# Patient Record
Sex: Female | Born: 1940 | Race: White | Hispanic: No | Smoking: Former smoker
Health system: Southern US, Community
[De-identification: ages and names within clinical notes are randomized; demographics above are authoritative.]

## PROBLEM LIST (undated history)

## (undated) DIAGNOSIS — M810 Age-related osteoporosis without current pathological fracture: Secondary | ICD-10-CM

## (undated) DIAGNOSIS — T7840XA Allergy, unspecified, initial encounter: Secondary | ICD-10-CM

## (undated) HISTORY — DX: Allergy, unspecified, initial encounter: T78.40XA

## (undated) HISTORY — PX: EYE SURGERY: SHX253

## (undated) HISTORY — DX: Age-related osteoporosis without current pathological fracture: M81.0

## (undated) HISTORY — PX: ABDOMINAL HYSTERECTOMY: SHX81

---

## 2010-09-28 DIAGNOSIS — C801 Malignant (primary) neoplasm, unspecified: Secondary | ICD-10-CM

## 2010-09-28 HISTORY — DX: Malignant (primary) neoplasm, unspecified: C80.1

## 2020-09-17 LAB — HM MAMMOGRAPHY

## 2020-09-17 LAB — HM DEXA SCAN

## 2020-12-11 ENCOUNTER — Ambulatory Visit: Payer: Medicare Other | Admitting: Family Medicine

## 2020-12-11 ENCOUNTER — Other Ambulatory Visit: Payer: Self-pay

## 2020-12-11 ENCOUNTER — Telehealth: Payer: Self-pay

## 2020-12-11 ENCOUNTER — Encounter: Payer: Self-pay | Admitting: Family Medicine

## 2020-12-11 DIAGNOSIS — M818 Other osteoporosis without current pathological fracture: Secondary | ICD-10-CM

## 2020-12-11 DIAGNOSIS — T386X5A Adverse effect of antigonadotrophins, antiestrogens, antiandrogens, not elsewhere classified, initial encounter: Secondary | ICD-10-CM

## 2020-12-11 DIAGNOSIS — H409 Unspecified glaucoma: Secondary | ICD-10-CM

## 2020-12-11 DIAGNOSIS — I4811 Longstanding persistent atrial fibrillation: Secondary | ICD-10-CM

## 2020-12-11 DIAGNOSIS — I4891 Unspecified atrial fibrillation: Secondary | ICD-10-CM | POA: Insufficient documentation

## 2020-12-11 DIAGNOSIS — Z853 Personal history of malignant neoplasm of breast: Secondary | ICD-10-CM | POA: Insufficient documentation

## 2020-12-11 NOTE — Progress Notes (Signed)
[facility] [address] [place] [address] Dept: [phone] Dept Fax: [phone]  New Patient Office Visit  Subjective:    Patient ID: Nicole Guzman, female    DOB: 07-22-1941, 80 y.o..   MRN: [id number]  Chief Complaint  Patient presents with  . Establish Care    NP- establish Care, no concerns.   Behind on Prolia injection due to moving.     History of Present Illness:  Patient is in today to establish care. [name] moved in January with her husband from [place], [place], where they had lived the last 74 years. She notes that with their age and her husband's health issues, they wanted to be closer to one of their two sons. She is retired form working within [employer] most of her life.  [name] has a history of left breast cancer found by mammogram in 2012. She underwent lumpectomy with lymph node biopsy. She did not require radiation treatment. She is currently on anastrozole. 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 In addition she takes calcium with Vit. D.  [name] has a history of atrial fibrillation. She is on metoprolol for rate control, though she denies any episodes of tachycardia with her a fib. She is also treated with Xarelto for anticoagulation.  [name] has a history of glaucoma. She relates that she had some minor eye procedures, suggesting there may have been a closed-angle component of this. She is currently on Travatan and Combigan drops for this.  [name] notes her husband is in the hospital currently with decompensated heart failure. She expects his release tomorrow.  Past Medical History: Patient Active Problem List   Diagnosis Date Noted  . History of breast cancer 12/11/2020  . Glaucoma 12/11/2020  . Atrial fibrillation ([place]) 12/11/2020  . 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 ABDOMINAL HYSTERECTOMY     age 3  . EYE SURGERY     Family History  Problem Relation Age of Onset  . Hyperlipidemia Mother   . Alzheimer's disease Mother   . Stroke Father   . Heart disease Father   . Alzheimer's disease Maternal Grandmother    Outpatient Medications Prior to Visit  Medication Sig Dispense Refill  . anastrozole (ARIMIDEX) 1 MG tablet Take 1 mg by mouth daily.    . Calcium Carb-Cholecalciferol (CALCIUM 500/D) 500-400 MG-UNIT CHEW Chew by mouth.    . COMBIGAN 0.2-0.5 % ophthalmic solution Apply 1 drop to eye 2 (two) times daily.    Marland Kitchen denosumab (PROLIA) 60 MG/ML SOSY injection Inject 60 mg into the skin every 6 (six) months.    . metoprolol tartrate (LOPRESSOR) 25 MG tablet Take 25 mg by mouth 2 (two) times daily.    . Multiple Vitamins-Minerals (MULTIVITAMIN WITH MINERALS) tablet Take 1 tablet by mouth daily.    . Travoprost, BAK Free, (TRAVATAN) 0.004 % SOLN ophthalmic solution INSTILL 1 DROP INTO EACH EYE EVERY DAY AT BEDTIME    . vitamin C (ASCORBIC ACID) 500 MG tablet Take 500 mg by mouth daily.    . Vitamin D, Cholecalciferol, 25 MCG (1000 UT) TABS Take by mouth.    Alveda Reasons 20 MG TABS tablet Take 20 mg by mouth daily.     No facility-administered medications prior to visit.   No Known Allergies    Objective:  Today's Vitals   12/11/20 1331  BP: 140/84  Pulse: 82  Temp: 98.5 F (36.9 C)  TempSrc: Temporal  SpO2: 94%  Weight: 172 lb 3.2 oz (78.1 kg)  Height: 5\' 5"  (1.651 m)   Body mass index is 28.66 kg/m.   General: Well developed, well nourished. No acute distress. CV: Irregularly irregular rhythm without murmurs or rubs.  Psych: Alert and oriented. Normal mood and affect.  Health Maintenance Due  Topic Date Due  . Hepatitis C Screening  Never done  . TETANUS/TDAP  Never done  . DEXA SCAN  Never done  . PNA vac Low Risk Adult (1 of 2 - PCV13) Never done     Assessment  & Plan:   1. History of breast cancer I will refer her to the [facility] for ongoing consultation regarding her breast cancer after care.  - Ambulatory referral to Oncology  2. Glaucoma of both eyes, unspecified glaucoma type I recommend she seek an appointment with [facility] for ongoing eye care related to her glaucoma.  3. Longstanding persistent atrial fibrillation (HCC) Currently on metoprolol for rate control and Xarelto. We will continue these.  4. 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 Staff will work to [employer] approval for the medication, order this in, and then contact the patient when it arrives to come in for injection.   [name], MD

## 2020-12-11 NOTE — Telephone Encounter (Signed)
NP- she needs to get started back on her Prolia since moving.  Can you please and thank you check into it? She is aware of the the process.  Thanks.  Dm/cma

## 2020-12-12 NOTE — Telephone Encounter (Signed)
I have faxed in paperwork to [employer] to start process.

## 2020-12-16 ENCOUNTER — Telehealth: Payer: Self-pay | Admitting: *Deleted

## 2020-12-16 NOTE — Telephone Encounter (Signed)
Called patient and gave upcoming appointments - mailed calendar with welcome packet. Requested medical records from previous cancer treatment - gave fax number

## 2020-12-26 ENCOUNTER — Encounter: Payer: Self-pay | Admitting: Family Medicine

## 2020-12-26 DIAGNOSIS — E785 Hyperlipidemia, unspecified: Secondary | ICD-10-CM | POA: Insufficient documentation

## 2020-12-26 DIAGNOSIS — K5790 Diverticulosis of intestine, part unspecified, without perforation or abscess without bleeding: Secondary | ICD-10-CM | POA: Insufficient documentation

## 2020-12-26 DIAGNOSIS — J309 Allergic rhinitis, unspecified: Secondary | ICD-10-CM | POA: Insufficient documentation

## 2020-12-26 DIAGNOSIS — E559 Vitamin D deficiency, unspecified: Secondary | ICD-10-CM | POA: Insufficient documentation

## 2021-01-16 ENCOUNTER — Telehealth: Payer: Self-pay

## 2021-01-16 ENCOUNTER — Encounter: Payer: Self-pay | Admitting: Hematology & Oncology

## 2021-01-16 ENCOUNTER — Inpatient Hospital Stay (HOSPITAL_BASED_OUTPATIENT_CLINIC_OR_DEPARTMENT_OTHER): Payer: Medicare Other | Admitting: Hematology & Oncology

## 2021-01-16 ENCOUNTER — Inpatient Hospital Stay: Payer: Medicare Other | Attending: Hematology & Oncology

## 2021-01-16 ENCOUNTER — Other Ambulatory Visit: Payer: Self-pay

## 2021-01-16 VITALS — BP 146/100 | HR 77 | Temp 98.6°F | Resp 18 | Ht 65.0 in | Wt 168.0 lb

## 2021-01-16 DIAGNOSIS — Z170 Estrogen receptor positive status [ER+]: Secondary | ICD-10-CM | POA: Insufficient documentation

## 2021-01-16 DIAGNOSIS — Z923 Personal history of irradiation: Secondary | ICD-10-CM | POA: Diagnosis not present

## 2021-01-16 DIAGNOSIS — Z853 Personal history of malignant neoplasm of breast: Secondary | ICD-10-CM | POA: Insufficient documentation

## 2021-01-16 DIAGNOSIS — Z7901 Long term (current) use of anticoagulants: Secondary | ICD-10-CM | POA: Diagnosis not present

## 2021-01-16 DIAGNOSIS — Z9223 Personal history of estrogen therapy: Secondary | ICD-10-CM | POA: Diagnosis not present

## 2021-01-16 DIAGNOSIS — Z7189 Other specified counseling: Secondary | ICD-10-CM | POA: Diagnosis not present

## 2021-01-16 DIAGNOSIS — Z79899 Other long term (current) drug therapy: Secondary | ICD-10-CM | POA: Insufficient documentation

## 2021-01-16 DIAGNOSIS — C50912 Malignant neoplasm of unspecified site of left female breast: Secondary | ICD-10-CM | POA: Diagnosis not present

## 2021-01-16 DIAGNOSIS — Z87891 Personal history of nicotine dependence: Secondary | ICD-10-CM | POA: Diagnosis not present

## 2021-01-16 HISTORY — DX: Other specified counseling: Z71.89

## 2021-01-16 HISTORY — DX: Malignant neoplasm of unspecified site of left female breast: C50.912

## 2021-01-16 LAB — CMP (CANCER CENTER ONLY)
ALT: 19 U/L (ref 0–44)
AST: 20 U/L (ref 15–41)
Albumin: 3.9 g/dL (ref 3.5–5.0)
Alkaline Phosphatase: 65 U/L (ref 38–126)
Anion gap: 8 (ref 5–15)
BUN: 13 mg/dL (ref 8–23)
CO2: 30 mmol/L (ref 22–32)
Calcium: 10 mg/dL (ref 8.9–10.3)
Chloride: 102 mmol/L (ref 98–111)
Creatinine: 0.76 mg/dL (ref 0.44–1.00)
GFR, Estimated: >60 mL/min (ref >60)
Glucose, Bld: 102 mg/dL — ABNORMAL HIGH (ref 70–99)
Potassium: 4.3 mmol/L (ref 3.5–5.1)
Sodium: 140 mmol/L (ref 135–145)
Total Bilirubin: 0.6 mg/dL (ref 0.3–1.2)
Total Protein: 6.7 g/dL (ref 6.5–8.1)

## 2021-01-16 LAB — CBC WITH DIFFERENTIAL (CANCER CENTER ONLY)
Abs Immature Granulocytes: 0.02 K/uL (ref 0.00–0.07)
Basophils Absolute: 0 K/uL (ref 0.0–0.1)
Basophils Relative: 0 %
Eosinophils Absolute: 0.1 K/uL (ref 0.0–0.5)
Eosinophils Relative: 1 %
HCT: 39.6 % (ref 36.0–46.0)
Hemoglobin: 13 g/dL (ref 12.0–15.0)
Immature Granulocytes: 0 %
Lymphocytes Relative: 27 %
Lymphs Abs: 2 K/uL (ref 0.7–4.0)
MCH: 31.9 pg (ref 26.0–34.0)
MCHC: 32.8 g/dL (ref 30.0–36.0)
MCV: 97.1 fL (ref 80.0–100.0)
Monocytes Absolute: 0.5 K/uL (ref 0.1–1.0)
Monocytes Relative: 7 %
Neutro Abs: 4.9 K/uL (ref 1.7–7.7)
Neutrophils Relative %: 65 %
Platelet Count: 259 K/uL (ref 150–400)
RBC: 4.08 MIL/uL (ref 3.87–5.11)
RDW: 14 % (ref 11.5–15.5)
WBC Count: 7.6 K/uL (ref 4.0–10.5)
nRBC: 0 % (ref 0.0–0.2)

## 2021-01-16 NOTE — Progress Notes (Signed)
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 New Patient (Initial Visit)  : I just moved here from [place].  HPI: Nicole Guzman is a very charming 80 year old postmenopausal white female.  She and her husband have been in the [place].  They have lived there for the whole lives.  However, because of their age and health issues, their family wanted her to move closer.  She has 2 sons who live in [place].  As such, the recently moved here.  Her history with breast cancer goes back to 2012.  She had a mammogram which showed an abnormality in the left breast.  Of note, she was on postmenopausal estrogens after her change of life.  She underwent a lobectomy I think in November 2012.  She was found to have a stage IIa lobular carcinoma.  She then underwent radiation therapy.  Afterwards, she was placed on tamoxifen follow-up by Arimidex.  She has absolutely had no problems.  She has had no issues with the Arimidex.  She does get Prolia twice a year.  She is getting this with her family doctor.  She has had no problems with cough or shortness of breath.  She has had no issues with nausea or vomiting.  There is been no change in bowel or bladder habits.  She does not smoke.  She does not drink.  There is no family history of breast cancer or other malignancy in the family.  She and her husband are now living in their own home.  He has his own health issues.  Overall, I would say performance status is ECOG 1.   Past Medical History:  Diagnosis Date  . Allergy   . Cancer [facility]) 2012   Breast  . Osteoporosis   :  Past Surgical History:  Procedure Laterality Date  . ABDOMINAL HYSTERECTOMY     age 40  . EYE SURGERY    :   Current Outpatient Medications:  .  anastrozole (ARIMIDEX) 1 MG tablet, Take 1 mg by mouth daily., Disp: , Rfl:  .   Calcium Carb-Cholecalciferol (CALCIUM 500/D) 500-400 MG-UNIT CHEW, Chew by mouth., Disp: , Rfl:  .  COMBIGAN 0.2-0.5 % ophthalmic solution, Apply 1 drop to eye 2 (two) times daily., Disp: , Rfl:  .  denosumab (PROLIA) 60 MG/ML SOSY injection, Inject 60 mg into the skin every 6 (six) months., Disp: , Rfl:  .  metoprolol tartrate (LOPRESSOR) 25 MG tablet, Take 25 mg by mouth 2 (two) times daily., Disp: , Rfl:  .  Multiple Vitamins-Minerals (MULTIVITAMIN WITH MINERALS) tablet, Take 1 tablet by mouth daily., Disp: , Rfl:  .  Travoprost, BAK Free, (TRAVATAN) 0.004 % SOLN ophthalmic solution, INSTILL 1 DROP INTO EACH EYE EVERY DAY AT BEDTIME, Disp: , Rfl:  .  vitamin C (ASCORBIC ACID) 500 MG tablet, Take 500 mg by mouth daily., Disp: , Rfl:  .  Vitamin D, Cholecalciferol, 25 MCG (1000 UT) TABS, Take by mouth., Disp: , Rfl:  .  XARELTO 20 MG TABS tablet, Take 20 mg by mouth daily., Disp: , Rfl: :  :  No Known Allergies:  Family History  Problem Relation Age of Onset  . Hyperlipidemia Mother   . Alzheimer's disease Mother   . Stroke Father   . Heart disease Father   . Alzheimer's disease Maternal Grandmother   :  Social History   Socioeconomic  History  . Marital status: Married    Spouse name: Not on file  . Number of children: Not on file  . Years of education: Not on file  . Highest education level: Not on file  Occupational History  . Not on file  Tobacco Use  . Smoking status: Former Research scientist (life sciences)  . Smokeless tobacco: Never Used  . Tobacco comment: quit 54 years ago  Vaping Use  . Vaping Use: Never used  Substance and Sexual Activity  . Alcohol use: Not Currently  . Drug use: Never  . Sexual activity: Not Currently  Other Topics Concern  . Not on file  Social History Narrative  . Not on file   Social Determinants of Health   Financial Resource Strain: Not on file  Food Insecurity: Not on file  Transportation Needs: Not on file  Physical Activity: Not on file  Stress: Not  on file  Social Connections: Not on file  Intimate Partner Violence: Not on file  :  Review of Systems  Constitutional: Negative.   HENT: Negative.   Eyes: Negative.   Respiratory: Negative.   Cardiovascular: Negative.   Gastrointestinal: Negative.   Genitourinary: Negative.   Musculoskeletal: Negative.   Skin: Negative.   Neurological: Negative.   Endo/Heme/Allergies: Negative.   Psychiatric/Behavioral: Negative.      Exam:  This is a well-developed and well-nourished white female in no obvious distress.  Vital signs show temperature of 98.6.  Pulse 77.  Blood pressure 146/100.  Weight is 168 pounds.  Head and neck exam shows no ocular or oral lesions.  There are no palpable cervical or supraclavicular lymph nodes.  Lungs are clear bilaterally.  Cardiac exam regular rate and rhythm with no murmurs, rubs or bruits.  Abdomen is soft.  She has good bowel sounds.  There is no fluid wave.  There is no palpable liver or spleen tip.  Breast exam shows right breast with no masses, edema or erythema.  There is no right axillary adenopathy.  Left breast is slightly contracted from surgery and radiation.  She has a lumpectomy scar at about the 8 o'clock position.  This is well-healed.  There is no left nipple abnormalities.  There is no left axillary adenopathy.  Back exam shows no tenderness over the spine, ribs or hips.  Extremities shows no clubbing, cyanosis or edema.  There is no swelling in the left arm.  Neurological exam shows no focal neurological deficits.  Skin exam shows no rashes, ecchymoses or petechia.   @IPVITALS @   Recent Labs    01/16/21 1322  WBC 7.6  HGB 13.0  HCT 39.6  PLT 259   Recent Labs    01/16/21 1322  NA 140  K 4.3  CL 102  CO2 30  GLUCOSE 102*  BUN 13  CREATININE 0.76  CALCIUM 10.0    Blood smear review: None  Pathology: See above    Assessment and Plan: Nicole Guzman is a very charming 80 year old white female with a stage IIa lobular  carcinoma of the left breast.  She underwent lumpectomy and radiation.  She currently is on Arimidex.  She is now 10 years out from therapy.  I told her that she can go ahead and stop the Arimidex if she would like.  I really do not think that she needs to be on it after this year at least.  I really think that the risk of her recurrence is probably going to be less than 10%.  She gets  her Prolia from her family doctor.  I think this is going be very helpful for her.  I think that we can just watch her in 6 months.  I do not see that we have to do any scans on her.  I am just happy that she has moved to the area.  She is happy that she is with her family.  I just hope her husband can get through his health issues.  I gave her a prayer blanket.  She is very thankful for this.

## 2021-01-16 NOTE — Telephone Encounter (Signed)
appts made and printed for pt per 01/16/21 los   Ambrie Carte 

## 2021-01-21 ENCOUNTER — Other Ambulatory Visit: Payer: Self-pay

## 2021-01-21 ENCOUNTER — Ambulatory Visit: Payer: Medicare Other

## 2021-01-21 DIAGNOSIS — M818 Other osteoporosis without current pathological fracture: Secondary | ICD-10-CM

## 2021-01-21 DIAGNOSIS — T386X5A Adverse effect of antigonadotrophins, antiestrogens, antiandrogens, not elsewhere classified, initial encounter: Secondary | ICD-10-CM

## 2021-01-21 MED ORDER — DENOSUMAB 60 MG/ML ~~LOC~~ SOSY
60.0000 mg | PREFILLED_SYRINGE | Freq: Once | SUBCUTANEOUS | Status: AC
  Administered 2021-01-21: 60 mg via SUBCUTANEOUS

## 2021-01-21 NOTE — Progress Notes (Signed)
Per orders of Dr. Gena Fray injection of Prolia given by Verline Lema L Breana Litts in left arm. Patient tolerated injection well. No signs or symptoms of a reaction were noted prior to patient leaving the nurse visit. Patient will make appointment for 6 months.

## 2021-01-21 NOTE — Patient Instructions (Signed)
Health Maintenance Due  Topic Date Due  . Hepatitis C Screening  Never done  . TETANUS/TDAP  Never done  . PNA vac Low Risk Adult (1 of 2 - PCV13) Never done    Depression screen [facility] 2/9 12/11/2020  Decreased Interest 0  Down, Depressed, Hopeless 0  PHQ - 2 Score 0

## 2021-02-11 ENCOUNTER — Ambulatory Visit (INDEPENDENT_AMBULATORY_CARE_PROVIDER_SITE_OTHER): Payer: Medicare Other

## 2021-02-11 VITALS — Ht 65.0 in | Wt 168.0 lb

## 2021-02-11 DIAGNOSIS — Z0000 Encounter for general adult medical examination without abnormal findings: Secondary | ICD-10-CM

## 2021-02-11 NOTE — Progress Notes (Signed)
Subjective:   Nicole Guzman is a 80 y.o. female who presents for an Initial Medicare Annual Wellness Visit.  I connected with Zarrah today by telephone and verified that I am speaking with the correct person using two identifiers. Location patient: home Location provider: work Persons participating in the virtual visit: patient, [name].    I discussed the limitations, risks, security and privacy concerns of performing an evaluation and management service by telephone and the availability of in person appointments. I also discussed with the patient that there may be a patient responsible charge related to this service. The patient expressed understanding and verbally consented to this telephonic visit.    Interactive audio and video telecommunications were attempted between this provider and patient, however failed, due to patient having technical difficulties OR patient did not have access to video capability.  We continued and completed visit with audio only.  Some vital signs may be absent or patient reported.   Time Spent with patient on telephone encounter: 25 minutes   Review of Systems     Cardiac Risk Factors include: advanced age (>32men, >12 women)     Objective:    Today's Vitals   02/11/21 0946  Weight: 168 lb (76.2 kg)  Height: 5\' 5"  (1.651 m)   Body mass index is 27.96 kg/m.  Advanced Directives 02/11/2021 01/16/2021  Does Patient Have a Medical Advance Directive? No No  Would patient like information on creating a medical advance directive? No - Patient declined No - Patient declined    Current Medications (verified) Outpatient Encounter Medications as of 02/11/2021  Medication Sig  . anastrozole (ARIMIDEX) 1 MG tablet Take 1 mg by mouth daily.  . Calcium Carb-Cholecalciferol (CALCIUM 500/D) 500-400 MG-UNIT CHEW Chew by mouth.  . COMBIGAN 0.2-0.5 % ophthalmic solution Apply 1 drop to eye 2 (two) times daily.  Marland Kitchen denosumab (PROLIA) 60 MG/ML SOSY injection  Inject 60 mg into the skin every 6 (six) months.  . metoprolol tartrate (LOPRESSOR) 25 MG tablet Take 25 mg by mouth 2 (two) times daily.  . Multiple Vitamins-Minerals (MULTIVITAMIN WITH MINERALS) tablet Take 1 tablet by mouth daily.  . Travoprost, BAK Free, (TRAVATAN) 0.004 % SOLN ophthalmic solution INSTILL 1 DROP INTO EACH EYE EVERY DAY AT BEDTIME  . vitamin C (ASCORBIC ACID) 500 MG tablet Take 500 mg by mouth daily.  . Vitamin D, Cholecalciferol, 25 MCG (1000 UT) TABS Take by mouth.  Nicole Guzman 20 MG TABS tablet Take 20 mg by mouth daily.   No facility-administered encounter medications on file as of 02/11/2021.    Allergies (verified) Patient has no known allergies.   History: Past Medical History:  Diagnosis Date  . Allergy   . Cancer [facility]) 2012   Breast  . Goals of care, counseling/discussion 01/16/2021  . Osteoporosis   . Stage II breast cancer, left ([place]) 01/16/2021   Past Surgical History:  Procedure Laterality Date  . ABDOMINAL HYSTERECTOMY     age 87  . EYE SURGERY     Family History  Problem Relation Age of Onset  . Hyperlipidemia Mother   . Alzheimer's disease Mother   . Stroke Father   . Heart disease Father   . Alzheimer's disease Maternal Grandmother    Social History   Socioeconomic History  . Marital status: Married    Spouse name: Not on file  . Number of children: Not on file  . Years of education: Not on file  . Highest education level: Not on file  Occupational History  . Occupation: retired  Tobacco Use  . Smoking status: Former Research scientist (life sciences)  . Smokeless tobacco: Never Used  . Tobacco comment: quit 54 years ago  Vaping Use  . Vaping Use: Never used  Substance and Sexual Activity  . Alcohol use: Not Currently  . Drug use: Never  . Sexual activity: Not Currently  Other Topics Concern  . Not on file  Social History Narrative  . Not on file   Social Determinants of Health   Financial Resource Strain: Low Risk   . Difficulty of Paying Living  Expenses: Not hard at all  Food Insecurity: No Food Insecurity  . Worried About Charity fundraiser in the Last Year: Never true  . Ran Out of Food in the Last Year: Never true  Transportation Needs: No Transportation Needs  . Lack of Transportation (Medical): No  . Lack of Transportation (Non-Medical): No  Physical Activity: Inactive  . Days of Exercise per Week: 0 days  . Minutes of Exercise per Session: 0 min  Stress: No Stress Concern Present  . Feeling of Stress : Not at all  Social Connections: Moderately Integrated  . Frequency of Communication with Friends and Family: More than three times a week  . Frequency of Social Gatherings with Friends and Family: More than three times a week  . Attends Religious Services: More than 4 times per year  . Active Member of Clubs or Organizations: No  . Attends Archivist Meetings: Never  . Marital Status: Married    Tobacco Counseling Counseling given: Not Answered Comment: quit 54 years ago   Clinical Intake:  Pre-visit preparation completed: Yes  Pain : No/denies pain     Nutritional Status: BMI 25 -29 Overweight Nutritional Risks: None Diabetes: No  How often do you need to have someone help you when you read instructions, pamphlets, or other written materials from your doctor or pharmacy?: 1 - Never  Diabetic?No  Interpreter Needed?: No  Information entered by :: Caroleen Hamman LPN   Activities of Daily Living In your present state of health, do you have any difficulty performing the following activities: 02/11/2021  Hearing? N  Vision? N  Difficulty concentrating or making decisions? N  Walking or climbing stairs? N  Dressing or bathing? N  Doing errands, shopping? N  Preparing Food and eating ? N  Using the Toilet? N  In the past six months, have you accidently leaked urine? N  Do you have problems with loss of bowel control? N  Managing your Medications? N  Managing your Finances? N  Housekeeping  or managing your Housekeeping? N    Patient Care Team: [name], MD as PCP - General (Family Medicine) Marin Olp Rudell Cobb, MD as Consulting Physician (Oncology)  Indicate any recent Medical Services you may have received from other than [facility] providers in the past year (date may be approximate).     Assessment:   This is a routine wellness examination for [place].  Hearing/Vision screen  Hearing Screening   125Hz  250Hz  500Hz  1000Hz  2000Hz  3000Hz  4000Hz  6000Hz  8000Hz   Right ear:           Left ear:           Comments: Mild hearing loss per patient  Vision Screening Comments: Wears glasses Last eye exam-01/2021-Dr. Julien Girt  Dietary issues and exercise activities discussed: Current Exercise Habits: The patient does not participate in regular exercise at present, Exercise limited by: None identified  Goals Addressed  This Visit's Progress   . Patient Stated       Eat healthier & start walking      Depression Screen PHQ 2/9 Scores 02/11/2021 12/11/2020  PHQ - 2 Score 0 0    Fall Risk Fall Risk  02/11/2021 12/11/2020  Falls in the past year? 0 1  Number falls in past yr: 0 0  Injury with Fall? 0 1  Comment - Rt srist sprain  Follow up Falls prevention discussed -    FALL RISK PREVENTION PERTAINING TO THE HOME:  Any stairs in or around the home? No  Home free of loose throw rugs in walkways, pet beds, electrical cords, etc? Yes  Adequate lighting in your home to reduce risk of falls? Yes   ASSISTIVE DEVICES UTILIZED TO PREVENT FALLS:  Life alert? No  Use of a cane, walker or w/c? No  Grab bars in the bathroom? Yes  Shower chair or bench in shower? No  Elevated toilet seat or a handicapped toilet? No   TIMED UP AND GO:  Was the test performed? No . Phone visit   Cognitive Function:Normal cognitive status assessed by this Nurse Health Advisor. No abnormalities found.          Immunizations Immunization History  Administered Date(s)  Administered  . Influenza-Unspecified 08/14/2020  . Moderna Sars-Covid-2 Vaccination 10/24/2019, 11/22/2019, 08/25/2020    TDAP status: Due, Education has been provided regarding the importance of this vaccine. Advised may receive this vaccine at local pharmacy or Health Dept. Aware to provide a copy of the vaccination record if obtained from local pharmacy or Health Dept. Verbalized acceptance and understanding.  Flu Vaccine status: Up to date  Pneumococcal vaccine status: Due, Education has been provided regarding the importance of this vaccine. Advised may receive this vaccine at local pharmacy or Health Dept. Aware to provide a copy of the vaccination record if obtained from local pharmacy or Health Dept. Verbalized acceptance and understanding.  Covid-19 vaccine status: Completed vaccines  Qualifies for Shingles Vaccine? Yes   Zostavax completed No   Shingrix Completed?: No.    Education has been provided regarding the importance of this vaccine. Patient has been advised to call insurance company to determine out of pocket expense if they have not yet received this vaccine. Advised may also receive vaccine at local pharmacy or Health Dept. Verbalized acceptance and understanding.  Screening Tests Health Maintenance  Topic Date Due  . Hepatitis C Screening  Never done  . TETANUS/TDAP  Never done  . PNA vac Low Risk Adult (1 of 2 - PCV13) Never done  . COVID-19 Vaccine (4 - Booster for Moderna series) 11/25/2020  . INFLUENZA VACCINE  04/28/2021  . DEXA SCAN  Completed  . HPV VACCINES  Aged Out    Health Maintenance  Health Maintenance Due  Topic Date Due  . Hepatitis C Screening  Never done  . TETANUS/TDAP  Never done  . PNA vac Low Risk Adult (1 of 2 - PCV13) Never done  . COVID-19 Vaccine (4 - Booster for Moderna series) 11/25/2020    Colorectal cancer screening: No longer required.   Mammogram status: Completed Bilateral 09/17/2020. Repeat every year  Bone Density  status: Completed 09/17/2020. Results reflect: Bone density results: OSTEOPENIA. Repeat every 2 years.  Lung Cancer Screening: (Low Dose CT Chest recommended if Age 54-80 years, 30 pack-year currently smoking OR have quit w/in 15years.) does not qualify.     Additional Screening:  Hepatitis C Screening: does not qualify  Vision Screening: Recommended annual ophthalmology exams for early detection of glaucoma and other disorders of the eye. Is the patient up to date with their annual eye exam?  Yes  Who is the provider or what is the name of the office in which the patient attends annual eye exams? Dr. Julien Girt  Dental Screening: Recommended annual dental exams for proper oral hygiene  Community Resource Referral / Chronic Care Management: CRR required this visit?  No   CCM required this visit?  No      Plan:     I have personally reviewed and noted the following in the patient's chart:   . Medical and social history . Use of alcohol, tobacco or illicit drugs  . Current medications and supplements including opioid prescriptions. Patient is not currently taking opioid prescriptions. . Functional ability and status . Nutritional status . Physical activity . Advanced directives . List of other physicians . Hospitalizations, surgeries, and ER visits in previous 12 months . Vitals . Screenings to include cognitive, depression, and falls . Referrals and appointments  In addition, I have reviewed and discussed with patient certain preventive protocols, quality metrics, and best practice recommendations. A written personalized care plan for preventive services as well as general preventive health recommendations were provided to patient.   Due to this being a telephonic visit, the after visit summary with patients personalized plan was offered to patient via mail or my-chart.  Patient would like to access on my-chart.    Marta Antu, LPN   1/60/1093  Nurse Health  Advisor  Nurse Notes: None

## 2021-02-11 NOTE — Patient Instructions (Signed)
[name] , Thank you for taking time to complete your Medicare Wellness Visit. I appreciate your ongoing commitment to your health goals. Please review the following plan we discussed and let me know if I can assist you in the future.   Screening recommendations/referrals: Colonoscopy: No longer required Mammogram: Completed 09/17/2020-Due 09/17/2021 Bone Density: Completed 09/17/2020-Due 09/17/2022 Recommended yearly ophthalmology/optometry visit for glaucoma screening and checkup Recommended yearly dental visit for hygiene and checkup  Vaccinations: Influenza vaccine: Up to date Pneumococcal vaccine: Up tot date Tdap vaccine: Discuss with pharmacy Shingles vaccine: Discuss with pharmacy   Covid-19:Up to date  Advanced directives: Declined information today  Conditions/risks identified: See problem list  Next appointment: Follow up in one year for your annual wellness visit 02/17/2022 @ 9:45   Preventive Care 65 Years and Older, Female Preventive care refers to lifestyle choices and visits with your health care provider that can promote health and wellness. What does preventive care include?  A yearly physical exam. This is also called an annual well check.  Dental exams once or twice a year.  Routine eye exams. Ask your health care provider how often you should have your eyes checked.  Personal lifestyle choices, including:  Daily care of your teeth and gums.  Regular physical activity.  Eating a healthy diet.  Avoiding tobacco and drug use.  Limiting alcohol use.  Practicing safe sex.  Taking low-dose aspirin every day.  Taking vitamin and mineral supplements as recommended by your health care provider. What happens during an annual well check? The services and screenings done by your health care provider during your annual well check will depend on your age, overall health, lifestyle risk factors, and family history of disease. Counseling  Your health care  provider may ask you questions about your:  Alcohol use.  Tobacco use.  Drug use.  Emotional well-being.  Home and relationship well-being.  Sexual activity.  Eating habits.  History of falls.  Memory and ability to understand (cognition).  Work and work Statistician.  Reproductive health. Screening  You may have the following tests or measurements:  Height, weight, and BMI.  Blood pressure.  Lipid and cholesterol levels. These may be checked every 5 years, or more frequently if you are over 40 years old.  Skin check.  Lung cancer screening. You may have this screening every year starting at age 59 if you have a 30-pack-year history of smoking and currently smoke or have quit within the past 15 years.  Fecal occult blood test (FOBT) of the stool. You may have this test every year starting at age 13.  Flexible sigmoidoscopy or colonoscopy. You may have a sigmoidoscopy every 5 years or a colonoscopy every 10 years starting at age 17.  Hepatitis C blood test.  Hepatitis B blood test.  Sexually transmitted disease (STD) testing.  Diabetes screening. This is done by checking your blood sugar (glucose) after you have not eaten for a while (fasting). You may have this done every 1-3 years.  Bone density scan. This is done to screen for osteoporosis. You may have this done starting at age 90.  Mammogram. This may be done every 1-2 years. Talk to your health care provider about how often you should have regular mammograms. Talk with your health care provider about your test results, treatment options, and if necessary, the need for more tests. Vaccines  Your health care provider may recommend certain vaccines, such as:  Influenza vaccine. This is recommended every year.  Tetanus, diphtheria,  and acellular pertussis (Tdap, Td) vaccine. You may need a Td booster every 10 years.  Zoster vaccine. You may need this after age 40.  Pneumococcal 13-valent conjugate (PCV13)  vaccine. One dose is recommended after age 76.  Pneumococcal polysaccharide (PPSV23) vaccine. One dose is recommended after age 69. Talk to your health care provider about which screenings and vaccines you need and how often you need them. This information is not intended to replace advice given to you by your health care provider. Make sure you discuss any questions you have with your health care provider. Document Released: 10/11/2015 Document Revised: 06/03/2016 Document Reviewed: 07/16/2015 Elsevier Interactive Patient Education  2017 [employer] Prevention in the Home Falls can cause injuries. They can happen to people of all ages. There are many things you can do to make your home safe and to help prevent falls. What can I do on the outside of my home?  Regularly fix the edges of walkways and driveways and fix any cracks.  Remove anything that might make you trip as you walk through a door, such as a raised step or threshold.  Trim any bushes or trees on the path to your home.  Use bright outdoor lighting.  Clear any walking paths of anything that might make someone trip, such as rocks or tools.  Regularly check to see if handrails are loose or broken. Make sure that both sides of any steps have handrails.  Any raised decks and porches should have guardrails on the edges.  Have any leaves, snow, or ice cleared regularly.  Use sand or salt on walking paths during winter.  Clean up any spills in your garage right away. This includes oil or grease spills. What can I do in the bathroom?  Use night lights.  Install grab bars by the toilet and in the tub and shower. Do not use towel bars as grab bars.  Use non-skid mats or decals in the tub or shower.  If you need to sit down in the shower, use a plastic, non-slip stool.  Keep the floor dry. Clean up any water that spills on the floor as soon as it happens.  Remove soap buildup in the tub or shower  regularly.  Attach bath mats securely with double-sided non-slip rug tape.  Do not have throw rugs and other things on the floor that can make you trip. What can I do in the bedroom?  Use night lights.  Make sure that you have a light by your bed that is easy to reach.  Do not use any sheets or blankets that are too big for your bed. They should not hang down onto the floor.  Have a firm chair that has side arms. You can use this for support while you get dressed.  Do not have throw rugs and other things on the floor that can make you trip. What can I do in the kitchen?  Clean up any spills right away.  Avoid walking on wet floors.  Keep items that you use a lot in easy-to-reach places.  If you need to reach something above you, use a strong step stool that has a grab bar.  Keep electrical cords out of the way.  Do not use floor polish or wax that makes floors slippery. If you must use wax, use non-skid floor wax.  Do not have throw rugs and other things on the floor that can make you trip. What can I do with my  stairs?  Do not leave any items on the stairs.  Make sure that there are handrails on both sides of the stairs and use them. Fix handrails that are broken or loose. Make sure that handrails are as long as the stairways.  Check any carpeting to make sure that it is firmly attached to the stairs. Fix any carpet that is loose or worn.  Avoid having throw rugs at the top or bottom of the stairs. If you do have throw rugs, attach them to the floor with carpet tape.  Make sure that you have a light switch at the top of the stairs and the bottom of the stairs. If you do not have them, ask someone to add them for you. What else can I do to help prevent falls?  Wear shoes that:  Do not have high heels.  Have rubber bottoms.  Are comfortable and fit you well.  Are closed at the toe. Do not wear sandals.  If you use a stepladder:  Make sure that it is fully  opened. Do not climb a closed stepladder.  Make sure that both sides of the stepladder are locked into place.  Ask someone to hold it for you, if possible.  Clearly mark and make sure that you can see:  Any grab bars or handrails.  First and last steps.  Where the edge of each step is.  Use tools that help you move around (mobility aids) if they are needed. These include:  Canes.  Walkers.  Scooters.  Crutches.  Turn on the lights when you go into a dark area. Replace any light bulbs as soon as they burn out.  Set up your furniture so you have a clear path. Avoid moving your furniture around.  If any of your floors are uneven, fix them.  If there are any pets around you, be aware of where they are.  Review your medicines with your doctor. Some medicines can make you feel dizzy. This can increase your chance of falling. Ask your doctor what other things that you can do to help prevent falls. This information is not intended to replace advice given to you by your health care provider. Make sure you discuss any questions you have with your health care provider. Document Released: 07/11/2009 Document Revised: 02/20/2016 Document Reviewed: 10/19/2014 Elsevier Interactive Patient Education  2017 [employer].

## 2021-03-12 ENCOUNTER — Other Ambulatory Visit: Payer: Self-pay

## 2021-03-13 ENCOUNTER — Ambulatory Visit: Payer: Medicare Other | Admitting: Family Medicine

## 2021-03-13 ENCOUNTER — Encounter: Payer: Self-pay | Admitting: Family Medicine

## 2021-03-13 VITALS — BP 116/76 | HR 76 | Temp 97.8°F | Ht 65.0 in | Wt 165.8 lb

## 2021-03-13 DIAGNOSIS — I4811 Longstanding persistent atrial fibrillation: Secondary | ICD-10-CM | POA: Diagnosis not present

## 2021-03-13 DIAGNOSIS — T386X5A Adverse effect of antigonadotrophins, antiestrogens, antiandrogens, not elsewhere classified, initial encounter: Secondary | ICD-10-CM

## 2021-03-13 DIAGNOSIS — Z853 Personal history of malignant neoplasm of breast: Secondary | ICD-10-CM | POA: Diagnosis not present

## 2021-03-13 DIAGNOSIS — H409 Unspecified glaucoma: Secondary | ICD-10-CM | POA: Diagnosis not present

## 2021-03-13 DIAGNOSIS — M818 Other osteoporosis without current pathological fracture: Secondary | ICD-10-CM | POA: Diagnosis not present

## 2021-03-13 NOTE — Progress Notes (Signed)
Nicole Guzman  Chronic Care Office Visit  Subjective:    Patient ID: Nicole Guzman, female    DOB: 09-03-1941, 80 y.o..   MRN: [id number]  Chief Complaint  Patient presents with   Follow-up    3 month f/u.   No concerns.     History of Present Illness:  Patient is in today for reassessment of chronic medical issues.  Nicole Guzman notes that she is doing well. She does have concerns for her husband's health, but feels her own issues are stable. She has not had any issues with palpitations related to her atrial fibrillation and continues on Xarelto. She denies any dsypnea or lower extremity swelling.  Since her last visit she has established with Dr. Julien Girt (eye) for monitoring of her glaucoma. She was also seen by Dr. Marin Olp for aftercare related to her breast cancer. She remains on Arimidex.  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     Calcium Carb-Cholecalciferol (CALCIUM 500/D) 500-400 MG-UNIT CHEW Chew by mouth.     COMBIGAN 0.2-0.5 % ophthalmic  solution Apply 1 drop to eye 2 (two) times daily.     denosumab (PROLIA) 60 MG/ML SOSY injection Inject 60 mg into the skin every 6 (six) months.     metoprolol tartrate (LOPRESSOR) 25 MG tablet Take 25 mg by mouth 2 (two) times daily.     Multiple Vitamins-Minerals (MULTIVITAMIN WITH MINERALS) tablet Take 1 tablet by mouth daily.     Travoprost, BAK Free, (TRAVATAN) 0.004 % SOLN ophthalmic solution INSTILL 1 DROP INTO EACH EYE EVERY DAY AT BEDTIME     vitamin C (ASCORBIC ACID) 500 MG tablet Take 500 mg by mouth daily.     Vitamin D, Cholecalciferol, 25 MCG (1000 UT) TABS Take by mouth.     XARELTO 20 MG TABS tablet Take 20 mg by mouth daily.     No facility-administered medications prior to visit.   No Known Allergies    Objective:   Today's Vitals   03/13/21 1016  BP: 116/76  Pulse: 76  Temp: 97.8 F (36.6 C)  TempSrc: Temporal  SpO2: 96%  Weight: 165 lb 12.8 oz (75.2 kg)  Height: 5\' 5"  (1.651 m)   Body mass index is 27.59 kg/m.   General: Well developed, well nourished. No acute distress. CV: RRR without murmurs or rubs. Pulses 2+ bilaterally. Extremities: No edema noted. Psych: Alert and oriented. Normal mood and affect.  Health Maintenance Due  Topic Date Due   Hepatitis C Screening  Never done   TETANUS/TDAP  Never done   Zoster Vaccines- Shingrix (1 of 2) Never done   PNA vac Low Risk Adult (1 of 2 - PCV13) Never done   COVID-19 Vaccine (4 - Booster for Moderna series) 11/25/2020     Assessment & Plan:   1. Longstanding persistent atrial fibrillation (HCC) Stable on metoprolol and Xarelto. Recommend reassessment in 3 months.  2. 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 Stable.  3. History of breast cancer Reviewed consult notes from oncology. Stable on Arimidex.  4. Glaucoma of both eyes, unspecified glaucoma type Continue Travatan. She will continue to follow with Dr. Julien Girt.   [name], MD

## 2021-04-18 ENCOUNTER — Other Ambulatory Visit: Payer: Self-pay | Admitting: Family Medicine

## 2021-04-18 DIAGNOSIS — I4811 Longstanding persistent atrial fibrillation: Secondary | ICD-10-CM

## 2021-04-18 MED ORDER — METOPROLOL TARTRATE 25 MG PO TABS: 25.0000 mg | ORAL_TABLET | Freq: Two times a day (BID) | ORAL | 3 refills | Status: DC

## 2021-04-18 NOTE — Progress Notes (Signed)
Patient requested refill of metoprolol during her husband's medical appointment today.  1. Longstanding persistent atrial fibrillation (HCC)  - metoprolol tartrate (LOPRESSOR) 25 MG tablet; Take 1 tablet (25 mg total) by mouth 2 (two) times daily.  Dispense: 180 tablet; Refill: 3

## 2021-06-11 ENCOUNTER — Other Ambulatory Visit: Payer: Self-pay

## 2021-06-13 ENCOUNTER — Ambulatory Visit: Payer: Medicare Other | Admitting: Family Medicine

## 2021-06-17 ENCOUNTER — Other Ambulatory Visit: Payer: Self-pay | Admitting: *Deleted

## 2021-06-17 MED ORDER — ANASTROZOLE 1 MG PO TABS: 1.0000 mg | ORAL_TABLET | Freq: Every day | ORAL | 6 refills | Status: DC

## 2021-07-08 ENCOUNTER — Ambulatory Visit (INDEPENDENT_AMBULATORY_CARE_PROVIDER_SITE_OTHER): Payer: Medicare Other

## 2021-07-08 DIAGNOSIS — Z23 Encounter for immunization: Secondary | ICD-10-CM | POA: Diagnosis not present

## 2021-07-08 NOTE — Progress Notes (Signed)
Per orders of Dr Gena Fray, injection of Influenza given by Armandina Gemma, cma.  Patient tolerated injection well.

## 2021-07-18 ENCOUNTER — Inpatient Hospital Stay: Payer: Medicare Other | Attending: Hematology & Oncology

## 2021-07-18 ENCOUNTER — Encounter: Payer: Self-pay | Admitting: Hematology & Oncology

## 2021-07-18 ENCOUNTER — Inpatient Hospital Stay: Payer: Medicare Other | Admitting: Hematology & Oncology

## 2021-07-18 ENCOUNTER — Other Ambulatory Visit: Payer: Self-pay

## 2021-07-18 VITALS — BP 150/54 | HR 50 | Temp 98.4°F | Resp 18 | Wt 162.0 lb

## 2021-07-18 DIAGNOSIS — C50912 Malignant neoplasm of unspecified site of left female breast: Secondary | ICD-10-CM | POA: Insufficient documentation

## 2021-07-18 DIAGNOSIS — Z170 Estrogen receptor positive status [ER+]: Secondary | ICD-10-CM | POA: Diagnosis not present

## 2021-07-18 DIAGNOSIS — Z79811 Long term (current) use of aromatase inhibitors: Secondary | ICD-10-CM | POA: Diagnosis not present

## 2021-07-18 DIAGNOSIS — Z853 Personal history of malignant neoplasm of breast: Secondary | ICD-10-CM

## 2021-07-18 LAB — CBC WITH DIFFERENTIAL (CANCER CENTER ONLY)
Abs Immature Granulocytes: 0.02 10*3/uL (ref 0.00–0.07)
Basophils Absolute: 0 10*3/uL (ref 0.0–0.1)
Basophils Relative: 1 %
Eosinophils Absolute: 0.1 10*3/uL (ref 0.0–0.5)
Eosinophils Relative: 1 %
HCT: 40.5 % (ref 36.0–46.0)
Hemoglobin: 13.1 g/dL (ref 12.0–15.0)
Immature Granulocytes: 0 %
Lymphocytes Relative: 32 %
Lymphs Abs: 2.1 10*3/uL (ref 0.7–4.0)
MCH: 31.3 pg (ref 26.0–34.0)
MCHC: 32.3 g/dL (ref 30.0–36.0)
MCV: 96.9 fL (ref 80.0–100.0)
Monocytes Absolute: 0.5 10*3/uL (ref 0.1–1.0)
Monocytes Relative: 8 %
Neutro Abs: 3.9 10*3/uL (ref 1.7–7.7)
Neutrophils Relative %: 58 %
Platelet Count: 266 10*3/uL (ref 150–400)
RBC: 4.18 MIL/uL (ref 3.87–5.11)
RDW: 14.1 % (ref 11.5–15.5)
WBC Count: 6.6 10*3/uL (ref 4.0–10.5)
nRBC: 0 % (ref 0.0–0.2)

## 2021-07-18 LAB — CMP (CANCER CENTER ONLY)
ALT: 16 U/L (ref 0–44)
AST: 18 U/L (ref 15–41)
Albumin: 3.9 g/dL (ref 3.5–5.0)
Alkaline Phosphatase: 56 U/L (ref 38–126)
Anion gap: 6 (ref 5–15)
BUN: 16 mg/dL (ref 8–23)
CO2: 32 mmol/L (ref 22–32)
Calcium: 10.4 mg/dL — ABNORMAL HIGH (ref 8.9–10.3)
Chloride: 101 mmol/L (ref 98–111)
Creatinine: 0.83 mg/dL (ref 0.44–1.00)
GFR, Estimated: >60 mL/min (ref >60)
Glucose, Bld: 92 mg/dL (ref 70–99)
Potassium: 4.4 mmol/L (ref 3.5–5.1)
Sodium: 139 mmol/L (ref 135–145)
Total Bilirubin: 0.7 mg/dL (ref 0.3–1.2)
Total Protein: 6.9 g/dL (ref 6.5–8.1)

## 2021-07-18 NOTE — Progress Notes (Signed)
Hematology and Oncology Follow Up Visit  Nicole Guzman [id number] 1941-02-19 80 y.o. 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  We first saw her back in April 2022.  [place]  [place].  Her husband comes in with her.  He recently had surgery for what seems to be a squamous cell carcinoma of the skull.  She is still on the Arimidex.  I told her that she can stop the Arimidex.  She has been on antiestrogen therapy for 10 years.  I think this is enough.  She has had no problems with cough or shortness of breath.  She has had no issues with nausea or vomiting.  There is been no problems with bowels or bladder.  She is due for a mammogram in December.  We will have to get this set up for her.  She gets Prolia at her family doctor's office.  I think she might be due for this now.  She has had no leg swelling.  She has had no rashes.  There is been no headache.  She has had no abdominal pain.  Overall, her performance status is ECOG 0.  Medications:  Current Outpatient Medications:    anastrozole (ARIMIDEX) 1 MG tablet, Take 1 tablet (1 mg total) by mouth daily., Disp: 30 tablet, Rfl: 6   Calcium Carb-Cholecalciferol (CALCIUM 500/D) 500-400 MG-UNIT CHEW, Chew by mouth., Disp: , Rfl:    COMBIGAN 0.2-0.5 % ophthalmic solution, Apply 1 drop to eye 2 (two) times daily., Disp: , Rfl:    denosumab (PROLIA) 60 MG/ML SOSY injection, Inject 60 mg into the skin every 6 (six) months., Disp: , Rfl:    metoprolol tartrate (LOPRESSOR) 25 MG tablet, Take 1 tablet (25 mg total) by mouth 2 (two) times daily., Disp: 180 tablet, Rfl: 3   Multiple Vitamins-Minerals (MULTIVITAMIN  WITH MINERALS) tablet, Take 1 tablet by mouth daily., Disp: , Rfl:    Travoprost, BAK Free, (TRAVATAN) 0.004 % SOLN ophthalmic solution, INSTILL 1 DROP INTO EACH EYE EVERY DAY AT BEDTIME, Disp: , Rfl:    vitamin C (ASCORBIC ACID) 500 MG tablet, Take 500 mg by mouth daily., Disp: , Rfl:    Vitamin D, Cholecalciferol, 25 MCG (1000 UT) TABS, Take by mouth., Disp: , Rfl:    XARELTO 20 MG TABS tablet, Take 20 mg by mouth daily., Disp: , Rfl:   Allergies: No Known Allergies  Past Medical History, Surgical history, Social history, and Family History were reviewed and updated.  Review of Systems: Review of Systems  Constitutional: Negative.   HENT:  Negative.    Eyes: Negative.   Respiratory: Negative.    Cardiovascular: Negative.   Gastrointestinal: Negative.   Endocrine: Negative.   Genitourinary: Negative.    Musculoskeletal: Negative.   Skin: Negative.   Neurological: Negative.   Hematological: Negative.   Psychiatric/Behavioral: Negative.     Physical Exam:  weight is 162 lb (73.5 kg). Her oral temperature is 98.4 F (36.9 C). Her blood pressure is 150/54 (abnormal) and her pulse is 50 (abnormal). Her respiration is 18 and oxygen saturation is 95%.   Wt Readings from Last 3 Encounters:  07/18/21  162 lb (73.5 kg)  03/13/21 165 lb 12.8 oz (75.2 kg)  02/11/21 168 lb (76.2 kg)    Physical Exam Vitals reviewed.  Constitutional:      Comments: Right breast shows no edema, erythema or swelling.  There is no nipple discharge.  There is no right axillary adenopathy.  Left breast shows changes from lumpectomy and radiation.  She has the lumpectomy scar at about the 8 o'clock position.  This is well-healed.  There is no left axillary adenopathy.  HENT:     Head: Normocephalic and atraumatic.  Eyes:     Pupils: Pupils are equal, round, and reactive to light.  Cardiovascular:     Rate and Rhythm: Normal rate and regular rhythm.     Heart sounds: Normal heart sounds.  Pulmonary:      Effort: Pulmonary effort is normal.     Breath sounds: Normal breath sounds.  Abdominal:     General: Bowel sounds are normal.     Palpations: Abdomen is soft.  Musculoskeletal:        General: No tenderness or deformity. Normal range of motion.     Cervical back: Normal range of motion.  Lymphadenopathy:     Cervical: No cervical adenopathy.  Skin:    General: Skin is warm and dry.     Findings: No erythema or rash.  Neurological:     Mental Status: She is alert and oriented to person, place, and time.  Psychiatric:        Behavior: Behavior normal.        Thought Content: Thought content normal.        Judgment: Judgment normal.     Lab Results  Component Value Date   WBC 6.6 07/18/2021   HGB 13.1 07/18/2021   HCT 40.5 07/18/2021   MCV 96.9 07/18/2021   PLT 266 07/18/2021     Chemistry      Component Value Date/Time   NA 139 07/18/2021 1133   K 4.4 07/18/2021 1133   CL 101 07/18/2021 1133   CO2 32 07/18/2021 1133   BUN 16 07/18/2021 1133   CREATININE 0.83 07/18/2021 1133      Component Value Date/Time   CALCIUM 10.4 (H) 07/18/2021 1133   ALKPHOS 56 07/18/2021 1133   AST 18 07/18/2021 1133   ALT 16 07/18/2021 1133   BILITOT 0.7 07/18/2021 1133      Impression and Plan: Nicole Guzman is a very nice 80 year old postmenopausal white female.  She has had history of stage IIa infiltrating lobular carcinoma of the left breast.  She had treatment for this.  This was about 10 years ago with radiation and surgery.  She was on Arimidex.  I think that everything is going well with her.  I really do not think the malignancy will come back.  I think the chance of recurrence is going to be less than 10%.  We have to make sure we get her set up with a mammogram.  I will have this set up in December.  I am just happy that she is doing well.  I am glad she moved down to the [place] area to be with her family.  I know this is important for her.  We will plan to get her back to  see [place] in another 6 months.   Volanda Napoleon, MD 10/21/20221:08 PM

## 2021-07-25 ENCOUNTER — Other Ambulatory Visit: Payer: Self-pay

## 2021-07-28 ENCOUNTER — Ambulatory Visit: Payer: Medicare Other | Admitting: Family Medicine

## 2021-08-04 ENCOUNTER — Encounter: Payer: Self-pay | Admitting: Family Medicine

## 2021-08-04 ENCOUNTER — Ambulatory Visit: Payer: Medicare Other | Admitting: Family Medicine

## 2021-08-04 ENCOUNTER — Other Ambulatory Visit: Payer: Self-pay

## 2021-08-04 VITALS — BP 122/76 | HR 85 | Temp 97.1°F | Ht 65.0 in | Wt 164.2 lb

## 2021-08-04 DIAGNOSIS — E559 Vitamin D deficiency, unspecified: Secondary | ICD-10-CM | POA: Diagnosis not present

## 2021-08-04 DIAGNOSIS — I4811 Longstanding persistent atrial fibrillation: Secondary | ICD-10-CM | POA: Diagnosis not present

## 2021-08-04 DIAGNOSIS — T386X5A Adverse effect of antigonadotrophins, antiestrogens, antiandrogens, not elsewhere classified, initial encounter: Secondary | ICD-10-CM | POA: Diagnosis not present

## 2021-08-04 DIAGNOSIS — M818 Other osteoporosis without current pathological fracture: Secondary | ICD-10-CM | POA: Diagnosis not present

## 2021-08-04 DIAGNOSIS — Z853 Personal history of malignant neoplasm of breast: Secondary | ICD-10-CM

## 2021-08-04 MED ORDER — DENOSUMAB 60 MG/ML ~~LOC~~ SOSY
60.0000 mg | PREFILLED_SYRINGE | Freq: Once | SUBCUTANEOUS | Status: AC
  Administered 2021-08-04: 60 mg via SUBCUTANEOUS

## 2021-08-04 NOTE — Progress Notes (Signed)
[facility] [address] [place] [address] Dept: [phone] Dept Fax: [phone]  Chronic Care Office Visit  Subjective:    Patient ID: Nicole Guzman, female    DOB: [birth year], 80 y.o..   MRN: [id number]  Chief Complaint  Patient presents with   Follow-up    Follow up, needing prolia injection. No concerns.     History of Present Illness:  Patient is in today for reassessment of chronic medical issues.  Nicole Guzman has a history of atrial fibrillation. She is on Xarelto. She has not experienced any palpitations and feels this is doing well overall.   Nicole Guzman is seeing Dr. Ander Slade (glaucoma specialist). She is on Combigan and Travatan drops. She was given some tips from Her eye doctor for helping to lower her pressures.  Nicole Guzman has followed up with Dr. Marin Olp (oncology). She has been advised that she can stop her Arimidex. She plans to finish out her current prescription for this.  Nicole Guzman has a history of osteoporosis related to her use of aromatase inhibitors. She is currently on Prolia. She is taking her calcium/Vit. D and a separate Vitamin D supplement.  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 30 tablet 6   Calcium Carb-Cholecalciferol (CALCIUM 500/D) 500-400 MG-UNIT CHEW Chew by mouth.     COMBIGAN 0.2-0.5 % ophthalmic solution Apply 1 drop to eye 2 (two) times daily.     denosumab (PROLIA) 60 MG/ML SOSY injection Inject 60 mg into the skin every 6 (six) months.     metoprolol tartrate (LOPRESSOR) 25 MG tablet Take 1 tablet (25 mg total) by mouth 2 (two) times daily. 180 tablet 3   Multiple Vitamins-Minerals (MULTIVITAMIN WITH MINERALS) tablet Take 1 tablet by mouth daily.     Travoprost, BAK Free, (TRAVATAN) 0.004 % SOLN ophthalmic solution INSTILL 1 DROP INTO EACH EYE EVERY DAY AT BEDTIME     vitamin C (ASCORBIC ACID) 500 MG tablet Take 500 mg by mouth daily.     Vitamin D, Cholecalciferol, 25 MCG (1000 UT) TABS Take by mouth.     XARELTO 20 MG TABS tablet Take 20 mg by mouth daily.     No facility-administered medications prior to visit.   No Known Allergies    Objective:   Today's Vitals   08/04/21 1553  BP: 122/76  Pulse: 85  Temp: (!) 97.1 F (36.2 C)  TempSrc: Temporal  SpO2: 96%  Weight: 164 lb 3.2 oz (74.5 kg)  Height: 5\' 5"  (1.651 m)   Body mass index is 27.32 kg/m.   General: Well developed, well  nourished. No acute distress. CV: Irregular rhythm, but normal rate without murmurs or rubs. Pulses 2+ bilaterally. Psych: Alert and oriented. Normal mood and affect.  Health Maintenance Due  Topic Date Due   Pneumonia Vaccine 40+ Years old (1 - PCV) Never done   Hepatitis C Screening  Never done   TETANUS/TDAP  Never done   Lab Results CMP Latest Ref Rng & Units 07/18/2021 01/16/2021  Glucose 70 - 99 mg/dL 92 102(H)  BUN 8 - 23 mg/dL 16 13  Creatinine 0.44 - 1.00 mg/dL 0.83 0.76  Sodium 135 - 145 mmol/L 139 140  Potassium 3.5 - 5.1 mmol/L 4.4 4.3  Chloride 98 - 111 mmol/L 101 102  CO2 22 - 32 mmol/L 32 30  Calcium 8.9 - 10.3 mg/dL 10.4(H)  10.0  Total Protein 6.5 - 8.1 g/dL 6.9 6.7  Total Bilirubin 0.3 - 1.2 mg/dL 0.7 0.6  Alkaline Phos 38 - 126 U/L 56 65  AST 15 - 41 U/L 18 20  ALT 0 - 44 U/L 16 19   CBC Latest Ref Rng & Units 07/18/2021 01/16/2021  WBC 4.0 - 10.5 K/uL 6.6 7.6  Hemoglobin 12.0 - 15.0 g/dL 13.1 13.0  Hematocrit 36.0 - 46.0 % 40.5 39.6  Platelets 150 - 400 K/uL 266 259     Assessment & Plan:   1. 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 We will continue with Prolia. Plan to repeat her DXA next Fall. If doing okay, we might consider stopping her Prolia, as she will be off of her aromatase inhibitors.  2. Longstanding persistent atrial fibrillation (HCC) Stable. Continue Xarelto.  3. History of breast cancer No sign of recurrence. Following with oncology.  4. Vitamin D deficiency On Vitamin D supplementation.  [name], MD

## 2021-08-18 ENCOUNTER — Telehealth: Payer: Self-pay | Admitting: Family Medicine

## 2021-08-18 DIAGNOSIS — I4811 Longstanding persistent atrial fibrillation: Secondary | ICD-10-CM

## 2021-08-18 MED ORDER — XARELTO 20 MG PO TABS: 20.0000 mg | ORAL_TABLET | Freq: Every day | ORAL | 11 refills | Status: DC

## 2021-08-18 NOTE — Telephone Encounter (Signed)
Patient notified VIA phone. Dm/cma  

## 2021-08-18 NOTE — Telephone Encounter (Signed)
Pt called and said she needs to get a refill on xarelto sent in to [facility] [address] in [place]

## 2021-08-18 NOTE — Telephone Encounter (Signed)
Refill request for: Xareltto 20 mg LR  HX provider LOV  08/04/21 FOV 02/12/22  Please review and advise.  Thanks. Dm/cma

## 2021-08-28 ENCOUNTER — Telehealth (HOSPITAL_BASED_OUTPATIENT_CLINIC_OR_DEPARTMENT_OTHER): Payer: Self-pay

## 2021-10-01 ENCOUNTER — Ambulatory Visit (HOSPITAL_BASED_OUTPATIENT_CLINIC_OR_DEPARTMENT_OTHER)
Admission: RE | Admit: 2021-10-01 | Discharge: 2021-10-01 | Disposition: A | Discharge status: Home / Self Care | Payer: Medicare PPO | Source: Ambulatory Visit | Attending: Hematology & Oncology | Admitting: Hematology & Oncology

## 2021-10-01 ENCOUNTER — Other Ambulatory Visit: Payer: Self-pay

## 2021-10-01 ENCOUNTER — Encounter (HOSPITAL_BASED_OUTPATIENT_CLINIC_OR_DEPARTMENT_OTHER): Payer: Self-pay

## 2021-10-01 DIAGNOSIS — Z1231 Encounter for screening mammogram for malignant neoplasm of breast: Secondary | ICD-10-CM | POA: Diagnosis not present

## 2021-10-01 DIAGNOSIS — C50912 Malignant neoplasm of unspecified site of left female breast: Secondary | ICD-10-CM | POA: Diagnosis present

## 2021-12-31 ENCOUNTER — Ambulatory Visit (INDEPENDENT_AMBULATORY_CARE_PROVIDER_SITE_OTHER): Payer: Medicare PPO | Admitting: Family Medicine

## 2021-12-31 VITALS — BP 126/70 | HR 60 | Temp 97.0°F | Ht 65.0 in | Wt 162.6 lb

## 2021-12-31 DIAGNOSIS — R040 Epistaxis: Secondary | ICD-10-CM | POA: Diagnosis not present

## 2021-12-31 NOTE — Progress Notes (Signed)
?[facility] ?[place] ?[place] [address] ?Dept: [phone] ?Dept Fax: [phone] ? ?Office Visit ? ?Subjective:  ? ? Patient ID: Nicole Guzman, female    DOB: 01-27-1941, 81 y.o..   MRN: [id number] ? ?Chief Complaint  ?Patient presents with  ? Acute Visit  ?  C/o having a nose bleed that took 45 mins to stop bleeding last night.    ? ? ?History of Present Illness: ? ?Patient is in today for evaluation of a nose bleed. [name] states she had a minor bleed earlier in the day yesterday. Last evening, the right nostril began to bleed. It took her 45 min. to get the bleeding to stop. She has had no more bleeding since. [name] does take rivaroxaban (Xarelto) due to chronic a fib. She notes she did have a nosebleed some years ago that required her to go to the ER, where she had her nose packed and a bleeding site ablated. ? ?Past Medical History: ?Patient Active Problem List  ? Diagnosis Date Noted  ? Stage II breast cancer, left ([place]) 01/16/2021  ? Borderline hyperlipidemia 12/26/2020  ? Vitamin D deficiency 12/26/2020  ? Allergic rhinitis 12/26/2020  ? Diverticulosis 12/26/2020  ? History of breast cancer 12/11/2020  ? Glaucoma 12/11/2020  ? Atrial fibrillation ([place]) 12/11/2020  ? 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 ?Past Surgical History:  ?Procedure Laterality Date  ? ABDOMINAL HYSTERECTOMY    ? age 28  ? EYE SURGERY    ? ?Family History  ?Problem Relation Age of Onset  ? Hyperlipidemia Mother   ? Alzheimer's disease Mother   ? Stroke Father   ? Heart disease Father   ? Alzheimer's disease Maternal Grandmother   ? ?Outpatient Medications Prior to Visit  ?Medication Sig Dispense Refill  ? Calcium Carb-Cholecalciferol (CALCIUM 500/D) 500-400 MG-UNIT CHEW Chew by mouth.    ? COMBIGAN 0.2-0.5 % ophthalmic solution Apply 1 drop to eye 2 (two) times daily.    ? denosumab (PROLIA) 60 MG/ML SOSY injection Inject 60 mg into the  skin every 6 (six) months.    ? metoprolol tartrate (LOPRESSOR) 25 MG tablet Take 1 tablet (25 mg total) by mouth 2 (two) times daily. 180 tablet 3  ? Multiple Vitamins-Minerals (MULTIVITAMIN WITH MINERALS) tablet Take 1 tablet by mouth daily.    ? Travoprost, BAK Free, (TRAVATAN) 0.004 % SOLN ophthalmic solution INSTILL 1 DROP INTO EACH EYE EVERY DAY AT BEDTIME    ? vitamin C (ASCORBIC ACID) 500 MG tablet Take 500 mg by mouth daily.    ? Vitamin D, Cholecalciferol, 25 MCG (1000 UT) TABS Take by mouth.    ? XARELTO 20 MG TABS tablet Take 1 tablet (20 mg total) by mouth daily. 30 tablet 11  ? anastrozole (ARIMIDEX) 1 MG tablet Take 1 tablet (1 mg total) by mouth daily. 30 tablet 6  ? ?No facility-administered medications prior to visit.  ? ?No Known Allergies ?   ?Objective:  ? ?Today's Vitals  ? 12/31/21 1020  ?BP: 126/70  ?Pulse: 60  ?Temp: (!) 97 ?F (36.1 ?C)  ?TempSrc: Temporal  ?SpO2: 94%  ?Weight: 162 lb 9.6 oz (73.8 kg)  ?Height: '5\' 5"'$  (1.651 m)  ? ?Body mass index is 27.06 kg/m?.  ? ?General: Well developed, well nourished. No acute distress. ?HEENT: No active bleeding noted. There is a rawness to the mucosa of the right antrum. ?Psych: Alert and oriented. Normal mood and affect. ? ?Health  Maintenance Due  ?Topic Date Due  ? Hepatitis C Screening  Never done  ? TETANUS/TDAP  Never done  ? Pneumonia Vaccine 60+ Years old (1 - PCV) Never done  ?   ?Assessment & Plan:  ? ?1. Acute anterior epistaxis ?Provided Ms. [name] with information on management of nose bleeds. I recommend she apply some Vaseline with a cotton swab to the mucosa just inside the right nare. She should seek emergent care if bleeding does not stop after 20 min. ? ?Return if symptoms worsen or fail to improve.  ? ?[name], MD ?

## 2021-12-31 NOTE — Patient Instructions (Signed)
Nosebleed, Adult A nosebleed is when blood comes out of the nose. Nosebleeds are common. Usually, they are not a sign of a serious condition. Nosebleeds can happen if a blood vessel in your nose starts to bleed or if the lining of your nose (mucous membrane) cracks. They are commonly caused by: Allergies. Colds. Picking your nose. Blowing your nose too hard. An injury from sticking an object into your nose or getting hit in the nose. Dry or cold air. Less common causes of nosebleeds include: Toxic fumes. Something abnormal in the nose or in the air-filled spaces in the bones of the face (sinuses). Growths in the nose, such as polyps. Blood thinners or conditions that cause blood to clot slowly. Certain illnesses or procedures that irritate or dry out the nasal passages. Follow these instructions at home: When you have a nosebleed:  Sit down and tilt your head slightly forward. Use a clean towel or tissue to pinch your nostrils under the bony part of your nose. After 5 minutes, let go of your nose and see if bleeding starts again. Do not release pressure before that time. If there is still bleeding, repeat the pinching and holding for 5 minutes or until the bleeding stops. Do not place tissues or gauze in the nose to stop the bleeding. Avoid lying down and avoid tilting your head backward. That may make blood collect in the throat and cause gagging or coughing. Use a nasal spray decongestant to help with a nosebleed as told by your health care provider. After a nosebleed: Avoid blowing your nose or sniffing for a number of hours. Avoid straining, lifting, or bending at the waist for several days. You may go back to other normal activities as you are able. If you are taking aspirin or blood thinners and you have nosebleeds, talk to your health care provider. These medicines make bleeding more likely. Ask your health care provider if you should stop taking the medicines or if you should  adjust the dose. Do not stop taking medicines that your health care provider has recommended unless he or she tells you to stop taking them. If your nosebleed was caused by dry mucous membranes, use over-the-counter saline nasal spray or gel and a humidifier as told by your health care provider. This will keep the mucous membranes moist and allow them to heal. If you need to use one of these products: Choose one that is water-soluble. Use only as much as you need and use it only as often as needed. Do not lie down right after you use it. If you get nosebleeds often, talk with your health care provider about medical treatments. Options may include: Nasal cautery. This treatment stops and prevents nosebleeds by using a chemical swab or electrical device to lightly burn tiny blood vessels inside the nose. Nasal packing. A gauze or other material is placed in the nose to keep constant pressure on the bleeding area. Contact a health care provider if you: Have a fever. Get nosebleeds often or more often than usual. Bruise very easily. Have a nosebleed from having something stuck in your nose. Have bleeding in your mouth. Vomit or cough up brown material. Have a nosebleed after you start a new medicine. Get help right away if: You have a nosebleed after a fall or a head injury. Your nosebleed does not go away after 20 minutes. You feel dizzy or weak. You have unusual bleeding from other parts of your body. You have unusual bruising on   other parts of your body. You become sweaty. You vomit blood. Summary A nosebleed is when blood comes out of the nose. Common causes include allergies, an injury to the nose, or cold or dry air. Initial treatment includes applying pressure for 5 minutes. Moisturizing the nose with saline nasal spray or gel after a nosebleed may help prevent future bleeding. Get help right away if your nosebleed does not go away after 20 minutes. This information is not intended  to replace advice given to you by your health care provider. Make sure you discuss any questions you have with your health care provider. Document Revised: 07/13/2019 Document Reviewed: 07/13/2019 Elsevier Patient Education  2022 [employer]  

## 2022-01-16 ENCOUNTER — Other Ambulatory Visit: Payer: Medicare Other

## 2022-01-16 ENCOUNTER — Ambulatory Visit: Payer: Medicare Other | Admitting: Hematology & Oncology

## 2022-02-12 ENCOUNTER — Encounter: Payer: Self-pay | Admitting: Family Medicine

## 2022-02-12 ENCOUNTER — Ambulatory Visit (INDEPENDENT_AMBULATORY_CARE_PROVIDER_SITE_OTHER): Payer: Medicare PPO | Admitting: Family Medicine

## 2022-02-12 VITALS — BP 128/80 | HR 75 | Temp 98.2°F | Ht 65.0 in | Wt 160.6 lb

## 2022-02-12 DIAGNOSIS — Z853 Personal history of malignant neoplasm of breast: Secondary | ICD-10-CM

## 2022-02-12 DIAGNOSIS — I4811 Longstanding persistent atrial fibrillation: Secondary | ICD-10-CM

## 2022-02-12 DIAGNOSIS — M818 Other osteoporosis without current pathological fracture: Secondary | ICD-10-CM

## 2022-02-12 DIAGNOSIS — R040 Epistaxis: Secondary | ICD-10-CM | POA: Diagnosis not present

## 2022-02-12 DIAGNOSIS — T386X5A Adverse effect of antigonadotrophins, antiestrogens, antiandrogens, not elsewhere classified, initial encounter: Secondary | ICD-10-CM

## 2022-02-12 NOTE — Progress Notes (Signed)
[facility] [address] [place] [address] Dept: [phone] Dept Fax: [phone]  Chronic Care Office Visit  Subjective:    Patient ID: Nicole Guzman, female    DOB: May 05, 1941, 81 y.o..   MRN: [id number]  Chief Complaint  Patient presents with   Follow-up    6 month f/u.  C/o having nose bleeds off/on every 2-3 days.      History of Present Illness:  Patient is in today for reassessment of chronic medical issues.  Nicole Guzman has a history of atrial fibrillation. She is on Xarelto. She has not experienced any palpitations and feels this is doing well overall. I did see her about 6 weeks ago with epistaxis. She has continued to have episodes of bleeding, though none as heavy as the one prior to that appointment.    Nicole Guzman has a history of breast cancer. She is now off of Arimidex.    Nicole Guzman has a history of osteoporosis related to her use of aromatase inhibitors. She is currently on Prolia. She is taking her calcium/Vit. D and a separate Vitamin D supplement. Her last DEXA scan was 12 /21/2021.  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     COMBIGAN 0.2-0.5 % ophthalmic solution Apply 1 drop to eye 2 (two) times daily.     denosumab (PROLIA) 60 MG/ML SOSY injection Inject 60 mg into the skin every 6 (six) months.     metoprolol tartrate (LOPRESSOR) 25 MG tablet Take 1 tablet (25 mg total) by mouth 2 (two) times daily. 180 tablet 3   Multiple Vitamins-Minerals (MULTIVITAMIN WITH MINERALS) tablet Take 1 tablet by mouth daily.     Travoprost, BAK Free, (TRAVATAN) 0.004 % SOLN ophthalmic solution INSTILL 1 DROP INTO EACH EYE EVERY DAY AT BEDTIME     vitamin C (ASCORBIC ACID) 500 MG tablet Take 500 mg by mouth daily.     Vitamin D, Cholecalciferol, 25 MCG (1000 UT) TABS Take by mouth.     XARELTO 20 MG TABS tablet Take 1 tablet (20 mg total) by mouth daily. 30 tablet 11   No facility-administered medications prior to visit.   No Known Allergies    Objective:   Today's Vitals   02/12/22 0938  BP: 128/80  Pulse: 75  Temp: 98.2 F (36.8 C)  TempSrc: Temporal  SpO2: 98%  Weight: 160 lb 9.6 oz (72.8 kg)  Height: '5\' 5"'$  (1.651 m)   Body mass index is 26.73 kg/m.   General: Well developed, well nourished. No acute distress. Psych: Alert and oriented. Normal mood and affect.  Health Maintenance Due  Topic Date Due   Hepatitis C Screening  Never done   TETANUS/TDAP  Never done   Pneumonia Vaccine 57+ Years old (1 - PCV) Never done     Assessment & Plan:   1. Longstanding persistent atrial fibrillation (HCC) Stable with good rate control on metoprolol 25 mg bid. She is also on Xarelto 20 mg daily for anticoagulation. We will continue both meds for now.  2. 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 Will be due for DEXA scan in Dec.  3. History of breast cancer Off of aromatase inhibitors. Continue cancer surveillance.  4. Recurrent epistaxis As the nosebleeds continue to recur, I will refer her to ENT to assess. Her  anticoagulation likely is exacerbating her bleeding, but I am hopeful that ENT may be able to cauterize a bleeding site to stop this.  - Ambulatory referral to ENT   Return in about 6 months (around 08/15/2022) for Reassessment.   [name], MD

## 2022-02-17 ENCOUNTER — Ambulatory Visit (INDEPENDENT_AMBULATORY_CARE_PROVIDER_SITE_OTHER): Payer: Medicare PPO

## 2022-02-17 DIAGNOSIS — Z0000 Encounter for general adult medical examination without abnormal findings: Secondary | ICD-10-CM | POA: Diagnosis not present

## 2022-02-17 NOTE — Progress Notes (Signed)
Subjective:   Nicole Guzman is a 81 y.o. female who presents for Medicare Annual (Subsequent) preventive examination.  I connected with Seward Speck  today by telephone and verified that I am speaking with the correct person using two identifiers. Location patient: home Location provider: work Persons participating in the virtual visit: patient, provider.   I discussed the limitations, risks, security and privacy concerns of performing an evaluation and management service by telephone and the availability of in person appointments. I also discussed with the patient that there may be a patient responsible charge related to this service. The patient expressed understanding and verbally consented to this telephonic visit.    Interactive audio and video telecommunications were attempted between this provider and patient, however failed, due to patient having technical difficulties OR patient did not have access to video capability.  We continued and completed visit with audio only.    Review of Systems     Cardiac Risk Factors include: advanced age (>28mn, >>13women)     Objective:    Today's Vitals   There is no height or weight on file to calculate BMI.     02/17/2022    8:26 AM 07/18/2021   12:04 PM 02/11/2021    9:49 AM 01/16/2021    2:00 PM  Advanced Directives  Does Patient Have a Medical Advance Directive? No No No No  Would patient like information on creating a medical advance directive? No - Patient declined No - Patient declined No - Patient declined No - Patient declined    Current Medications (verified) Outpatient Encounter Medications as of 02/17/2022  Medication Sig   Calcium Carb-Cholecalciferol (CALCIUM 500/D) 500-400 MG-UNIT CHEW Chew by mouth.   COMBIGAN 0.2-0.5 % ophthalmic solution Apply 1 drop to eye 2 (two) times daily.   denosumab (PROLIA) 60 MG/ML SOSY injection Inject 60 mg into the skin every 6 (six) months.   metoprolol tartrate (LOPRESSOR) 25  MG tablet Take 1 tablet (25 mg total) by mouth 2 (two) times daily.   Multiple Vitamins-Minerals (MULTIVITAMIN WITH MINERALS) tablet Take 1 tablet by mouth daily.   Travoprost, BAK Free, (TRAVATAN) 0.004 % SOLN ophthalmic solution INSTILL 1 DROP INTO EACH EYE EVERY DAY AT BEDTIME   vitamin C (ASCORBIC ACID) 500 MG tablet Take 500 mg by mouth daily.   Vitamin D, Cholecalciferol, 25 MCG (1000 UT) TABS Take by mouth.   XARELTO 20 MG TABS tablet Take 1 tablet (20 mg total) by mouth daily.   No facility-administered encounter medications on file as of 02/17/2022.    Allergies (verified) Patient has no known allergies.   History: Past Medical History:  Diagnosis Date   Allergy    Cancer [place]   Breast   Goals of care, counseling/discussion 01/16/2021   Osteoporosis    Stage II breast cancer, [place] 01/16/2021   Past Surgical History:  Procedure Laterality Date   ABDOMINAL HYSTERECTOMY     age 81  EYE SURGERY     Family History  Problem Relation Age of Onset   Hyperlipidemia Mother    Alzheimer's disease Mother    Stroke Father    Heart disease Father    Alzheimer's disease Maternal Grandmother    Social History   Socioeconomic History   Marital status: Married    Spouse name: Not on file   Number of children: Not on file   Years of education: Not on file   Highest education level: Not on file  Occupational History  Occupation: retired  Tobacco Use   Smoking status: Former   Smokeless tobacco: Never   Tobacco comments:    quit 20 years ago  Vaping Use   Vaping Use: Never used  Substance and Sexual Activity   Alcohol use: Not Currently   Drug use: Never   Sexual activity: Yes  Other Topics Concern   Not on file  Social History Narrative   Not on file   Social Determinants of Health   Financial Resource Strain: Low Risk    Difficulty of Paying Living Expenses: Not hard at all  Food Insecurity: No Food Insecurity   Worried About Charity fundraiser in  the Last Year: Never true   [place] in the Last Year: Never true  Transportation Needs: No Transportation Needs   Lack of Transportation (Medical): No   Lack of Transportation (Non-Medical): No  Physical Activity: Inactive   Days of Exercise per Week: 0 days   Minutes of Exercise per Session: 0 min  Stress: No Stress Concern Present   Feeling of Stress : Not at all  Social Connections: Moderately Integrated   Frequency of Communication with Friends and Family: Three times a week   Frequency of Social Gatherings with Friends and Family: Three times a week   Attends Religious Services: 1 to 4 times per year   Active Member of Clubs or Organizations: No   Attends Archivist Meetings: Never   Marital Status: Married    Tobacco Counseling Counseling given: Not Answered Tobacco comments: quit 54 years ago   Clinical Intake:  Pre-visit preparation completed: Yes  Pain : No/denies pain     Nutritional Risks: None Diabetes: No  How often do you need to have someone help you when you read instructions, pamphlets, or other written materials from your doctor or pharmacy?: 1 - Never What is the last grade level you completed in school?: [place]   Interpreter Needed?: No  Information entered by :: [facility]    02/17/2022    8:29 AM  In your present state of health, do you have any difficulty performing the following activities:  Hearing? 0  Vision? 0  Difficulty concentrating or making decisions? 0  Walking or climbing stairs? 0  Dressing or bathing? 0  Doing errands, shopping? 0  Preparing Food and eating ? N  Using the Toilet? N  In the past six months, have you accidently leaked urine? N  Do you have problems with loss of bowel control? N  Managing your Medications? N  Managing your Finances? N  Housekeeping or managing your Housekeeping? N    Patient Care Team: [name], MD as PCP - General  (Family Medicine) Marin Olp Rudell Cobb, MD as Consulting Physician (Oncology) Ander Slade, Carlisle Beers, MD as Referring Physician (Ophthalmology)  Indicate any recent Medical Services you may have received from other than [facility] providers in the past year (date may be approximate).     Assessment:   This is a routine wellness examination for [place].  Hearing/Vision screen Vision Screening - Comments:: Annual exam wears glasses   Dietary issues and exercise activities discussed: Current Exercise Habits: The patient does not participate in regular exercise at present, Exercise limited by: None identified   Goals Addressed   None    Depression Screen    02/17/2022    8:27 AM 02/17/2022    8:24 AM 08/04/2021    3:52 PM 02/11/2021  9:52 AM 12/11/2020    1:29 PM  PHQ 2/9 Scores  PHQ - 2 Score 0 0 0 0 0    Fall Risk    02/17/2022    8:26 AM 08/04/2021    3:52 PM 02/11/2021    9:51 AM 12/11/2020    1:31 PM  [place] in the past year? 0 0 0 1  Number falls in past yr: 0 0 0 0  Injury with Fall? 0  0 1  Comment    Rt srist sprain  Follow up Falls evaluation completed  Falls prevention discussed     FALL RISK PREVENTION PERTAINING TO THE HOME:  Any stairs in or around the home? No  If so, are there any without handrails? No  Home free of loose throw rugs in walkways, pet beds, electrical cords, etc? Yes  Adequate lighting in your home to reduce risk of falls? Yes   ASSISTIVE DEVICES UTILIZED TO PREVENT FALLS:  Life alert? No  Use of a cane, walker or w/c? No  Grab bars in the bathroom? No  Shower chair or bench in shower? No  Elevated toilet seat or a handicapped toilet? No     Cognitive Function:    Normal cognitive status assessed by direct observation by this Nurse Health Advisor. No abnormalities found.      Immunizations Immunization History  Administered Date(s) Administered   Fluad Quad(high Dose 65+) 07/08/2021   Influenza-Unspecified 08/14/2020   Moderna  Sars-Covid-2 Vaccination 10/24/2019, 11/22/2019, 08/25/2020   Zoster Recombinat (Shingrix) 02/17/2021, 05/12/2021    TDAP status: Due, Education has been provided regarding the importance of this vaccine. Advised may receive this vaccine at local pharmacy or Health Dept. Aware to provide a copy of the vaccination record if obtained from local pharmacy or Health Dept. Verbalized acceptance and understanding.  Flu Vaccine status: Up to date  Pneumococcal vaccine status: Due, Education has been provided regarding the importance of this vaccine. Advised may receive this vaccine at local pharmacy or Health Dept. Aware to provide a copy of the vaccination record if obtained from local pharmacy or Health Dept. Verbalized acceptance and understanding.  Covid-19 vaccine status: Completed vaccines  Qualifies for Shingles Vaccine? Yes   Zostavax completed No   Shingrix Completed?: No.    Education has been provided regarding the importance of this vaccine. Patient has been advised to call insurance company to determine out of pocket expense if they have not yet received this vaccine. Advised may also receive vaccine at local pharmacy or Health Dept. Verbalized acceptance and understanding.  Screening Tests Health Maintenance  Topic Date Due   Hepatitis C Screening  Never done   TETANUS/TDAP  Never done   Pneumonia Vaccine 1+ Years old (1 - PCV) Never done   INFLUENZA VACCINE  04/28/2022   DEXA SCAN  Completed   Zoster Vaccines- Shingrix  Completed   HPV VACCINES  Aged Out   COVID-19 Vaccine  Discontinued    Health Maintenance  Health Maintenance Due  Topic Date Due   Hepatitis C Screening  Never done   TETANUS/TDAP  Never done   Pneumonia Vaccine 87+ Years old (1 - PCV) Never done    Colorectal cancer screening: No longer required.   Mammogram status: No longer required due to age.  Bone Density status: Completed 09/17/2020. Results reflect: Bone density results: OSTEOPENIA. Repeat  every 5 years.  Lung Cancer Screening: (Low Dose CT Chest recommended if Age 87-80 years, 30 pack-year currently  smoking OR have quit w/in 15years.) does not qualify.   Lung Cancer Screening Referral: n/a  Additional Screening:  Hepatitis C Screening: does not qualify;  Vision Screening: Recommended annual ophthalmology exams for early detection of glaucoma and other disorders of the eye. Is the patient up to date with their annual eye exam?  Yes  Who is the provider or what is the name of the office in which the patient attends annual eye exams? Dr.Atkins  If pt is not established with a provider, would they like to be referred to a provider to establish care? No .   Dental Screening: Recommended annual dental exams for proper oral hygiene  Community Resource Referral / Chronic Care Management: CRR required this visit?  No   CCM required this visit?  No      Plan:     I have personally reviewed and noted the following in the patient's chart:   Medical and social history Use of alcohol, tobacco or illicit drugs  Current medications and supplements including opioid prescriptions.  Functional ability and status Nutritional status Physical activity Advanced directives List of other physicians Hospitalizations, surgeries, and ER visits in previous 12 months Vitals Screenings to include cognitive, depression, and falls Referrals and appointments  In addition, I have reviewed and discussed with patient certain preventive protocols, quality metrics, and best practice recommendations. A written personalized care plan for preventive services as well as general preventive health recommendations were provided to patient.     Randel Pigg, LPN   1/76/1607   Nurse Notes: none

## 2022-02-17 NOTE — Patient Instructions (Signed)
Ms. Honda , Thank you for taking time to come for your Medicare Wellness Visit. I appreciate your ongoing commitment to your health goals. Please review the following plan we discussed and let me know if I can assist you in the future.   Screening recommendations/referrals: Colonoscopy: no longer required  Mammogram: no longer required  Bone Density: 09/17/2020 Recommended yearly ophthalmology/optometry visit for glaucoma screening and checkup Recommended yearly dental visit for hygiene and checkup  Vaccinations: Influenza vaccine: completed  Pneumococcal vaccine: due  Tdap vaccine: due  Shingles vaccine: will consider     Advanced directives: none   Conditions/risks identified: none   Next appointment: none    Preventive Care 79 Years and Older, Female Preventive care refers to lifestyle choices and visits with your health care provider that can promote health and wellness. What does preventive care include? A yearly physical exam. This is also called an annual well check. Dental exams once or twice a year. Routine eye exams. Ask your health care provider how often you should have your eyes checked. Personal lifestyle choices, including: Daily care of your teeth and gums. Regular physical activity. Eating a healthy diet. Avoiding tobacco and drug use. Limiting alcohol use. Practicing safe sex. Taking low-dose aspirin every day. Taking vitamin and mineral supplements as recommended by your health care provider. What happens during an annual well check? The services and screenings done by your health care provider during your annual well check will depend on your age, overall health, lifestyle risk factors, and family history of disease. Counseling  Your health care provider may ask you questions about your: Alcohol use. Tobacco use. Drug use. Emotional well-being. Home and relationship well-being. Sexual activity. Eating habits. History of falls. Memory and  ability to understand (cognition). Work and work Statistician. Reproductive health. Screening  You may have the following tests or measurements: Height, weight, and BMI. Blood pressure. Lipid and cholesterol levels. These may be checked every 5 years, or more frequently if you are over 73 years old. Skin check. Lung cancer screening. You may have this screening every year starting at age 56 if you have a 30-pack-year history of smoking and currently smoke or have quit within the past 15 years. Fecal occult blood test (FOBT) of the stool. You may have this test every year starting at age 50. Flexible sigmoidoscopy or colonoscopy. You may have a sigmoidoscopy every 5 years or a colonoscopy every 10 years starting at age 36. Hepatitis C blood test. Hepatitis B blood test. Sexually transmitted disease (STD) testing. Diabetes screening. This is done by checking your blood sugar (glucose) after you have not eaten for a while (fasting). You may have this done every 1-3 years. Bone density scan. This is done to screen for osteoporosis. You may have this done starting at age 7. Mammogram. This may be done every 1-2 years. Talk to your health care provider about how often you should have regular mammograms. Talk with your health care provider about your test results, treatment options, and if necessary, the need for more tests. Vaccines  Your health care provider may recommend certain vaccines, such as: Influenza vaccine. This is recommended every year. Tetanus, diphtheria, and acellular pertussis (Tdap, Td) vaccine. You may need a Td booster every 10 years. Zoster vaccine. You may need this after age 49. Pneumococcal 13-valent conjugate (PCV13) vaccine. One dose is recommended after age 37. Pneumococcal polysaccharide (PPSV23) vaccine. One dose is recommended after age 55. Talk to your health care provider about which screenings  and vaccines you need and how often you need them. This information is  not intended to replace advice given to you by your health care provider. Make sure you discuss any questions you have with your health care provider. Document Released: 10/11/2015 Document Revised: 06/03/2016 Document Reviewed: 07/16/2015 Elsevier Interactive Patient Education  2017 [place] Prevention in the Home Falls can cause injuries. They can happen to people of all ages. There are many things you can do to make your home safe and to help prevent falls. What can I do on the outside of my home? Regularly fix the edges of walkways and driveways and fix any cracks. Remove anything that might make you trip as you walk through a door, such as a raised step or threshold. Trim any bushes or trees on the path to your home. Use bright outdoor lighting. Clear any walking paths of anything that might make someone trip, such as rocks or tools. Regularly check to see if handrails are loose or broken. Make sure that both sides of any steps have handrails. Any raised decks and porches should have guardrails on the edges. Have any leaves, snow, or ice cleared regularly. Use sand or salt on walking paths during winter. Clean up any spills in your garage right away. This includes oil or grease spills. What can I do in the bathroom? Use night lights. Install grab bars by the toilet and in the tub and shower. Do not use towel bars as grab bars. Use non-skid mats or decals in the tub or shower. If you need to sit down in the shower, use a plastic, non-slip stool. Keep the floor dry. Clean up any water that spills on the floor as soon as it happens. Remove soap buildup in the tub or shower regularly. Attach bath mats securely with double-sided non-slip rug tape. Do not have throw rugs and other things on the floor that can make you trip. What can I do in the bedroom? Use night lights. Make sure that you have a light by your bed that is easy to reach. Do not use any sheets or blankets that  are too big for your bed. They should not hang down onto the floor. Have a firm chair that has side arms. You can use this for support while you get dressed. Do not have throw rugs and other things on the floor that can make you trip. What can I do in the kitchen? Clean up any spills right away. Avoid walking on wet floors. Keep items that you use a lot in easy-to-reach places. If you need to reach something above you, use a strong step stool that has a grab bar. Keep electrical cords out of the way. Do not use floor polish or wax that makes floors slippery. If you must use wax, use non-skid floor wax. Do not have throw rugs and other things on the floor that can make you trip. What can I do with my stairs? Do not leave any items on the stairs. Make sure that there are handrails on both sides of the stairs and use them. Fix handrails that are broken or loose. Make sure that handrails are as long as the stairways. Check any carpeting to make sure that it is firmly attached to the stairs. Fix any carpet that is loose or worn. Avoid having throw rugs at the top or bottom of the stairs. If you do have throw rugs, attach them to the floor with carpet tape.  Make sure that you have a light switch at the top of the stairs and the bottom of the stairs. If you do not have them, ask someone to add them for you. What else can I do to help prevent falls? Wear shoes that: Do not have high heels. Have rubber bottoms. Are comfortable and fit you well. Are closed at the toe. Do not wear sandals. If you use a stepladder: Make sure that it is fully opened. Do not climb a closed stepladder. Make sure that both sides of the stepladder are locked into place. Ask someone to hold it for you, if possible. Clearly mark and make sure that you can see: Any grab bars or handrails. First and last steps. Where the edge of each step is. Use tools that help you move around (mobility aids) if they are needed. These  include: Canes. Walkers. Scooters. Crutches. Turn on the lights when you go into a dark area. Replace any light bulbs as soon as they burn out. Set up your furniture so you have a clear path. Avoid moving your furniture around. If any of your floors are uneven, fix them. If there are any pets around you, be aware of where they are. Review your medicines with your doctor. Some medicines can make you feel dizzy. This can increase your chance of falling. Ask your doctor what other things that you can do to help prevent falls. This information is not intended to replace advice given to you by your health care provider. Make sure you discuss any questions you have with your health care provider. Document Released: 07/11/2009 Document Revised: 02/20/2016 Document Reviewed: 10/19/2014 Elsevier Interactive Patient Education  2017 [employer].

## 2022-03-10 ENCOUNTER — Telehealth: Payer: Self-pay | Admitting: Family Medicine

## 2022-03-10 NOTE — Telephone Encounter (Signed)
Spoke to patient, she went 2 weeks no nose bleeds then ha 1 yesterday that lasted 10 mins.  Should she keep taking the Xarelto before her ENT appt.?  Please review and advise.  Thanks   Dm/cma

## 2022-03-10 NOTE — Telephone Encounter (Signed)
Pt has an appt on Wednesday the 21st 3:00 at the ENT. She would like to know should she stop or change her dosage of xorelto before this appt.

## 2022-03-11 NOTE — Telephone Encounter (Signed)
Patient notified VIA phone. No further questions. Dm/cma  

## 2022-03-13 ENCOUNTER — Telehealth: Payer: Self-pay

## 2022-03-13 NOTE — Telephone Encounter (Signed)
Patient insurance verified and approved to receive Prolia injection. Okay to sent prescription to pharmacy? Please advise

## 2022-03-16 ENCOUNTER — Other Ambulatory Visit: Payer: Self-pay

## 2022-03-16 DIAGNOSIS — M818 Other osteoporosis without current pathological fracture: Secondary | ICD-10-CM

## 2022-03-16 MED ORDER — DENOSUMAB 60 MG/ML ~~LOC~~ SOSY: 60.0000 mg | PREFILLED_SYRINGE | SUBCUTANEOUS | 0 refills | Status: DC

## 2022-03-16 NOTE — Telephone Encounter (Signed)
Pt has gotten a call from her pharmacy [facility] on [address] letting her know that they have her Prolia injection. She is confused on why they are calling her and they let her know they didn't know why either. Please advise pt, she is confused.   [phone]

## 2022-03-16 NOTE — Telephone Encounter (Signed)
Spoke to patient, advised for her to pick up the RX and then schedule a nurse visit so we can give the Prolia to her. Dm/cma

## 2022-03-18 DIAGNOSIS — R040 Epistaxis: Secondary | ICD-10-CM | POA: Diagnosis not present

## 2022-03-23 ENCOUNTER — Telehealth: Payer: Self-pay | Admitting: Family Medicine

## 2022-03-23 NOTE — Telephone Encounter (Signed)
Pt has scheduled a Prolia injection for 04/01/22 with a nurse. She has gotten the injection from her pharmacy and is keeping it refrigerated Please advise pt at [phone] if anything needs to be done further.

## 2022-03-26 DIAGNOSIS — R040 Epistaxis: Secondary | ICD-10-CM | POA: Diagnosis not present

## 2022-03-27 ENCOUNTER — Inpatient Hospital Stay: Payer: Medicare PPO

## 2022-03-27 ENCOUNTER — Inpatient Hospital Stay: Payer: Medicare PPO | Attending: Hematology & Oncology | Admitting: Hematology & Oncology

## 2022-03-27 ENCOUNTER — Encounter: Payer: Self-pay | Admitting: Hematology & Oncology

## 2022-03-27 ENCOUNTER — Other Ambulatory Visit: Payer: Self-pay

## 2022-03-27 VITALS — BP 140/71 | HR 77 | Temp 98.2°F | Resp 18 | Ht 65.0 in | Wt 158.8 lb

## 2022-03-27 DIAGNOSIS — C50912 Malignant neoplasm of unspecified site of left female breast: Secondary | ICD-10-CM | POA: Diagnosis not present

## 2022-03-27 DIAGNOSIS — Z7901 Long term (current) use of anticoagulants: Secondary | ICD-10-CM | POA: Diagnosis not present

## 2022-03-27 DIAGNOSIS — R040 Epistaxis: Secondary | ICD-10-CM | POA: Insufficient documentation

## 2022-03-27 DIAGNOSIS — I4891 Unspecified atrial fibrillation: Secondary | ICD-10-CM | POA: Diagnosis not present

## 2022-03-27 DIAGNOSIS — I509 Heart failure, unspecified: Secondary | ICD-10-CM | POA: Insufficient documentation

## 2022-03-27 DIAGNOSIS — Z853 Personal history of malignant neoplasm of breast: Secondary | ICD-10-CM | POA: Insufficient documentation

## 2022-03-27 LAB — CBC WITH DIFFERENTIAL (CANCER CENTER ONLY)
Abs Immature Granulocytes: 0.02 10*3/uL (ref 0.00–0.07)
Basophils Absolute: 0 10*3/uL (ref 0.0–0.1)
Basophils Relative: 0 %
Eosinophils Absolute: 0.1 10*3/uL (ref 0.0–0.5)
Eosinophils Relative: 1 %
HCT: 41.9 % (ref 36.0–46.0)
Hemoglobin: 13.6 g/dL (ref 12.0–15.0)
Immature Granulocytes: 0 %
Lymphocytes Relative: 25 %
Lymphs Abs: 2.3 10*3/uL (ref 0.7–4.0)
MCH: 32.1 pg (ref 26.0–34.0)
MCHC: 32.5 g/dL (ref 30.0–36.0)
MCV: 98.8 fL (ref 80.0–100.0)
Monocytes Absolute: 0.7 10*3/uL (ref 0.1–1.0)
Monocytes Relative: 8 %
Neutro Abs: 5.9 10*3/uL (ref 1.7–7.7)
Neutrophils Relative %: 66 %
Platelet Count: 257 10*3/uL (ref 150–400)
RBC: 4.24 MIL/uL (ref 3.87–5.11)
RDW: 13.8 % (ref 11.5–15.5)
WBC Count: 9 10*3/uL (ref 4.0–10.5)
nRBC: 0 % (ref 0.0–0.2)

## 2022-03-27 LAB — CMP (CANCER CENTER ONLY)
ALT: 12 U/L (ref 0–44)
AST: 16 U/L (ref 15–41)
Albumin: 4.1 g/dL (ref 3.5–5.0)
Alkaline Phosphatase: 65 U/L (ref 38–126)
Anion gap: 4 — ABNORMAL LOW (ref 5–15)
BUN: 15 mg/dL (ref 8–23)
CO2: 33 mmol/L — ABNORMAL HIGH (ref 22–32)
Calcium: 10.3 mg/dL (ref 8.9–10.3)
Chloride: 102 mmol/L (ref 98–111)
Creatinine: 0.87 mg/dL (ref 0.44–1.00)
GFR, Estimated: >60 mL/min (ref >60)
Glucose, Bld: 94 mg/dL (ref 70–99)
Potassium: 4.5 mmol/L (ref 3.5–5.1)
Sodium: 139 mmol/L (ref 135–145)
Total Bilirubin: 0.6 mg/dL (ref 0.3–1.2)
Total Protein: 7.1 g/dL (ref 6.5–8.1)

## 2022-03-27 LAB — LACTATE DEHYDROGENASE: LDH: 155 U/L (ref 98–192)

## 2022-03-27 NOTE — Progress Notes (Signed)
Hematology and Oncology Follow Up Visit  Lavelle Berland [id number] 25-Apr-1941 81 y.o. 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 daily     Interim History:  Ms. Jaeger is back for follow-up.  She has had some issues.  The big issue is with her poor husband.  He apparently has had problems with aortic valvular issues.  He is not a candidate for any further aortic valve surgery.  It sounds like they try to do a TAVR on him.  Sounds like he still is having some valve issues with likely aortic regurgitation.  Ms. Scoggin has had problems with epistaxis.  Initially, it was the left nostril.  She had this cauterized.  Recently, she did have bleeding from the right nostril.  Again she is on Xarelto for the atrial fibrillation.  She did have the right nares cauterized.  However, there is still some intermittent bleeding.  Otherwise, she seems to be managing.  She has had no problems with bony pain.  There is no nausea or vomiting.  She has had no cough or shortness of breath.  She has had no fever.  She has had no rashes.  There is been no leg swelling.  Overall, I would say performance status is probably ECOG 1.  We first saw her back in April 2022.  [place]  [place] nice [place].  Her husband comes in with her.  He recently had surgery for what seems to be a squamous cell carcinoma of the skull.  She is still on the Arimidex.  I told her that she can stop the Arimidex.  She has been on antiestrogen therapy for 10 years.  I think this is enough.  She has had no problems with cough or shortness of breath.  She has had no issues with nausea or vomiting.  There is been no problems with bowels or  bladder.  Her last mammogram was back in January of this year.  Everything looked fine on the mammogram.  Overall, her performance status is ECOG 1.  Medications:  Current Outpatient Medications:    Calcium Carb-Cholecalciferol (CALCIUM 500/D) 500-400 MG-UNIT CHEW, Chew by mouth., Disp: , Rfl:    COMBIGAN 0.2-0.5 % ophthalmic solution, Apply 1 drop to eye 2 (two) times daily., Disp: , Rfl:    denosumab (PROLIA) 60 MG/ML SOSY injection, Inject 60 mg into the skin every 6 (six) months., Disp: 180 mL, Rfl: 0   metoprolol tartrate (LOPRESSOR) 25 MG tablet, Take 1 tablet (25 mg total) by mouth 2 (two) times daily., Disp: 180 tablet, Rfl: 3   Multiple Vitamins-Minerals (MULTIVITAMIN WITH MINERALS) tablet, Take 1 tablet by mouth daily., Disp: , Rfl:    Travoprost, BAK Free, (TRAVATAN) 0.004 % SOLN ophthalmic solution, INSTILL 1 DROP INTO EACH EYE EVERY DAY AT BEDTIME, Disp: , Rfl:    vitamin C (ASCORBIC ACID) 500 MG tablet, Take 500 mg by mouth daily., Disp: , Rfl:    Vitamin D, Cholecalciferol, 25 MCG (1000 UT) TABS, Take by mouth., Disp: , Rfl:    XARELTO 20 MG TABS tablet, Take 1 tablet (20 mg total) by mouth daily., Disp: 30 tablet, Rfl: 11  Allergies: No Known Allergies  Past Medical  History, Surgical history, Social history, and Family History were reviewed and updated.  Review of Systems: Review of Systems  Constitutional: Negative.   HENT:  Negative.    Eyes: Negative.   Respiratory: Negative.    Cardiovascular: Negative.   Gastrointestinal: Negative.   Endocrine: Negative.   Genitourinary: Negative.    Musculoskeletal: Negative.   Skin: Negative.   Neurological: Negative.   Hematological: Negative.   Psychiatric/Behavioral: Negative.      Physical Exam:  height is '5\' 5"'$  (1.651 m) and weight is 158 lb 12.8 oz (72 kg). Her oral temperature is 98.2 F (36.8 C). Her blood pressure is 140/71 and her pulse is 77. Her respiration is 18 and oxygen saturation is 98%.   Wt Readings  from Last 3 Encounters:  03/27/22 158 lb 12.8 oz (72 kg)  02/12/22 160 lb 9.6 oz (72.8 kg)  12/31/21 162 lb 9.6 oz (73.8 kg)    Physical Exam Vitals reviewed.  Constitutional:      Comments: Right breast shows no edema, erythema or swelling.  There is no nipple discharge.  There is no right axillary adenopathy.  Left breast shows changes from lumpectomy and radiation.  She has the lumpectomy scar at about the 8 o'clock position.  This is well-healed.  There is no left axillary adenopathy.  HENT:     Head: Normocephalic and atraumatic.  Eyes:     Pupils: Pupils are equal, round, and reactive to light.  Cardiovascular:     Rate and Rhythm: Normal rate and regular rhythm.     Heart sounds: Normal heart sounds.  Pulmonary:     Effort: Pulmonary effort is normal.     Breath sounds: Normal breath sounds.  Abdominal:     General: Bowel sounds are normal.     Palpations: Abdomen is soft.  Musculoskeletal:        General: No tenderness or deformity. Normal range of motion.     Cervical back: Normal range of motion.  Lymphadenopathy:     Cervical: No cervical adenopathy.  Skin:    General: Skin is warm and dry.     Findings: No erythema or rash.  Neurological:     Mental Status: She is alert and oriented to person, place, and time.  Psychiatric:        Behavior: Behavior normal.        Thought Content: Thought content normal.        Judgment: Judgment normal.      Lab Results  Component Value Date   WBC 9.0 03/27/2022   HGB 13.6 03/27/2022   HCT 41.9 03/27/2022   MCV 98.8 03/27/2022   PLT 257 03/27/2022     Chemistry      Component Value Date/Time   NA 139 03/27/2022 1000   K 4.5 03/27/2022 1000   CL 102 03/27/2022 1000   CO2 33 (H) 03/27/2022 1000   BUN 15 03/27/2022 1000   CREATININE 0.87 03/27/2022 1000      Component Value Date/Time   CALCIUM 10.3 03/27/2022 1000   ALKPHOS 65 03/27/2022 1000   AST 16 03/27/2022 1000   ALT 12 03/27/2022 1000   BILITOT 0.6  03/27/2022 1000      Impression and Plan: Ms. Fillingim is a very nice 81 year old postmenopausal white female.  She has had history of stage IIa infiltrating lobular carcinoma of the left breast.  She had treatment for this.  This was about 11 years ago with radiation and surgery.  She was  on Arimidex.  We stopped the Arimidex in October.  Hopefully, the epistaxis will not be a problem.  I realize that she is on Xarelto and needs to be on Xarelto for the atrial fibrillation.  Hopefully, her husband will be able to have a little bit better quality of life.  I know that they really cannot go much anywhere because of his congestive heart failure.  We will plan to get her back to see [place] in another 6 months I want to make sure that we keep in contact with her with the issues going on.     Volanda Napoleon, MD 6/30/202310:49 AM

## 2022-04-01 ENCOUNTER — Ambulatory Visit (INDEPENDENT_AMBULATORY_CARE_PROVIDER_SITE_OTHER): Payer: Medicare PPO

## 2022-04-01 DIAGNOSIS — T386X5A Adverse effect of antigonadotrophins, antiestrogens, antiandrogens, not elsewhere classified, initial encounter: Secondary | ICD-10-CM

## 2022-04-01 DIAGNOSIS — M818 Other osteoporosis without current pathological fracture: Secondary | ICD-10-CM

## 2022-04-01 MED ORDER — DENOSUMAB 60 MG/ML ~~LOC~~ SOSY
60.0000 mg | PREFILLED_SYRINGE | Freq: Once | SUBCUTANEOUS | Status: AC
  Administered 2022-04-01: 60 mg via SUBCUTANEOUS

## 2022-04-01 NOTE — Progress Notes (Signed)
Patient presents for prolia injection. Injection placed in left deltoid region. Patient tolerated procedure well with no concerns

## 2022-04-12 ENCOUNTER — Other Ambulatory Visit: Payer: Self-pay | Admitting: Family Medicine

## 2022-04-12 DIAGNOSIS — I4811 Longstanding persistent atrial fibrillation: Secondary | ICD-10-CM

## 2022-04-21 MED ORDER — DENOSUMAB 60 MG/ML ~~LOC~~ SOSY
60.0000 mg | PREFILLED_SYRINGE | Freq: Once | SUBCUTANEOUS | Status: AC
  Administered 2022-04-01: 60 mg via SUBCUTANEOUS

## 2022-04-21 NOTE — Addendum Note (Signed)
Addended by: Alycia Rossetti on: 04/21/2022 09:56 AM   Modules accepted: Orders

## 2022-04-21 NOTE — Addendum Note (Signed)
Addended by: Gerilyn Nestle on: 04/21/2022 02:55 PM   Modules accepted: Orders

## 2022-04-21 NOTE — Progress Notes (Signed)
Addendum to fix charge and document lack of waste

## 2022-04-24 IMAGING — MG MM DIGITAL SCREENING BILAT W/ TOMO AND CAD
8 series · 8 of 24 positions shown · non-contrast
Comparison: Previous exam(s).

CLINICAL DATA: Screening. History of treated left breast cancer.

EXAM:
DIGITAL SCREENING BILATERAL MAMMOGRAM WITH TOMOSYNTHESIS AND CAD
TECHNIQUE: Bilateral screening digital craniocaudal and mediolateral oblique
mammograms were obtained. Bilateral screening digital breast
tomosynthesis was performed. The images were evaluated with
computer-aided detection.

[L CC synth-2D]
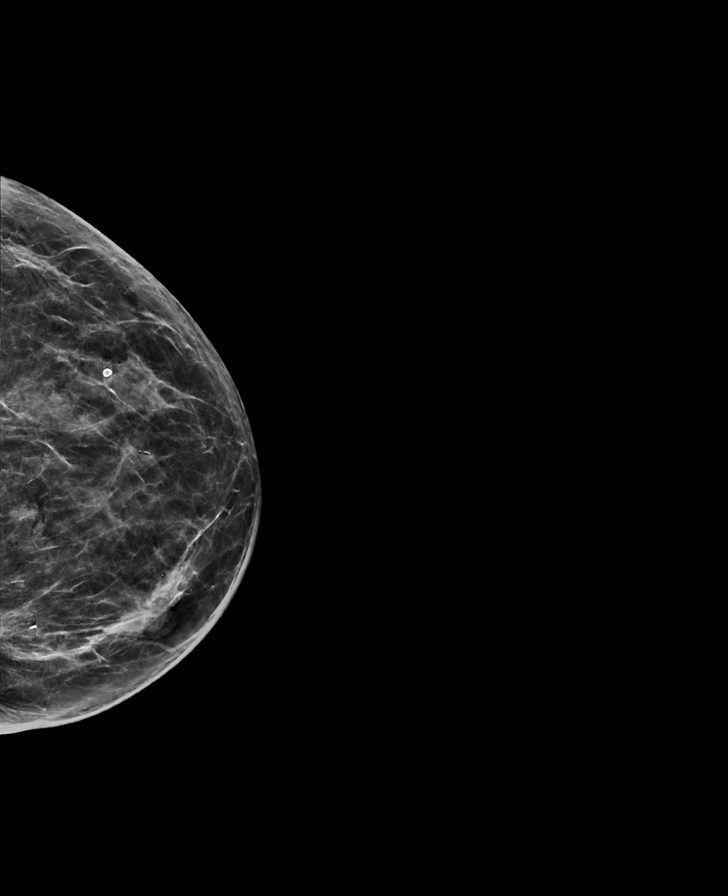

[R CC synth-2D]
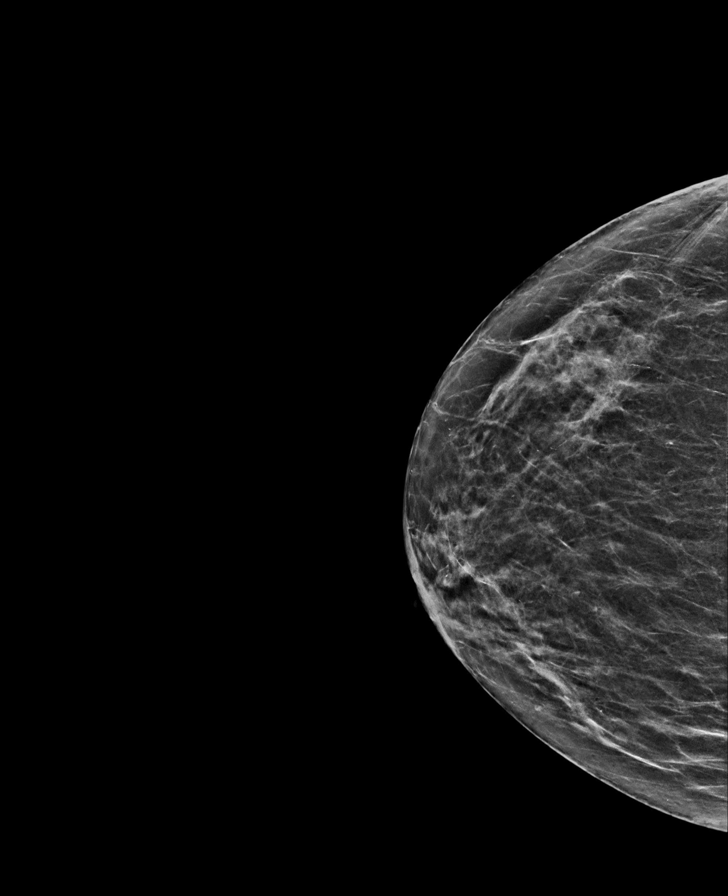

[R MLO synth-2D]
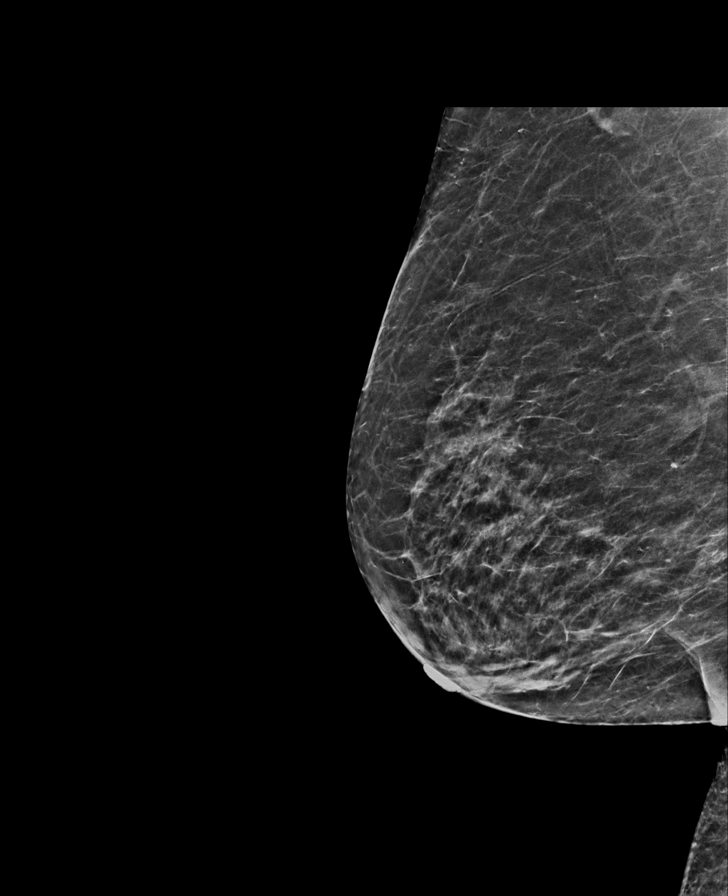

[L MLO synth-2D]
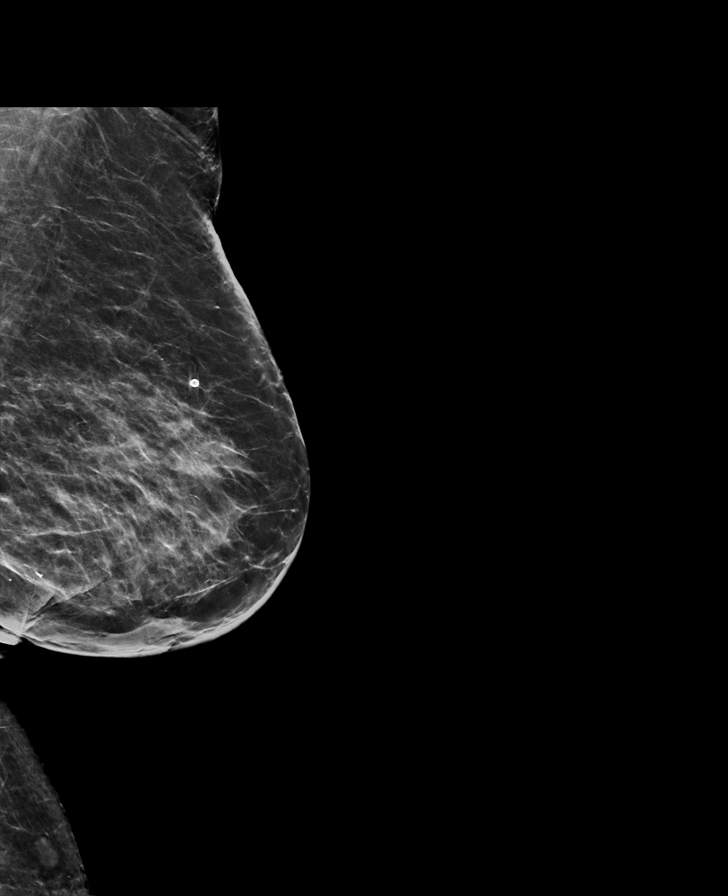

[L CC tomo · tomo slice 31/61.0]
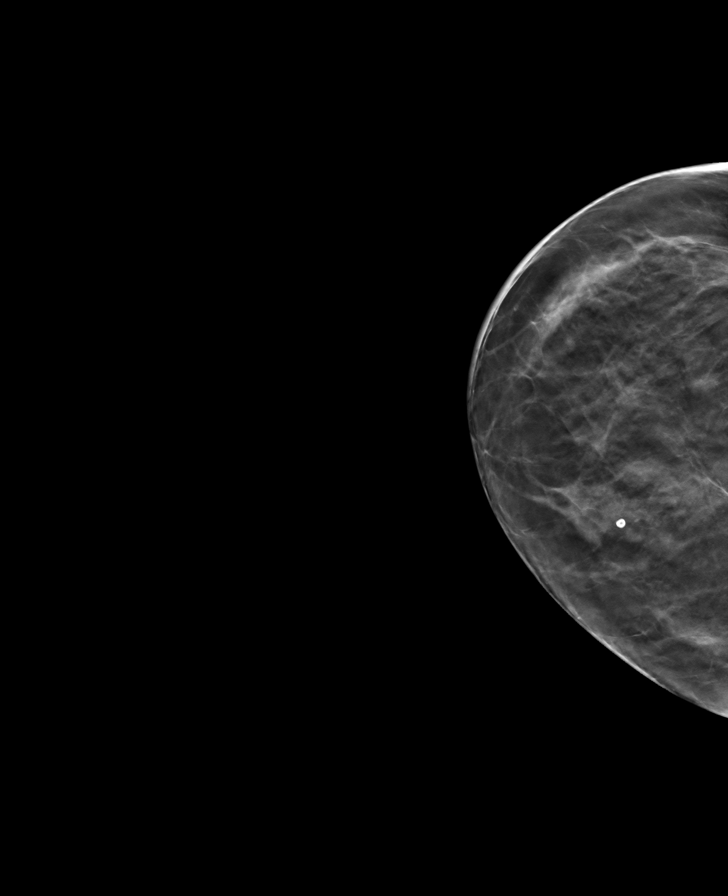

[L MLO tomo · tomo slice 35/69.0]
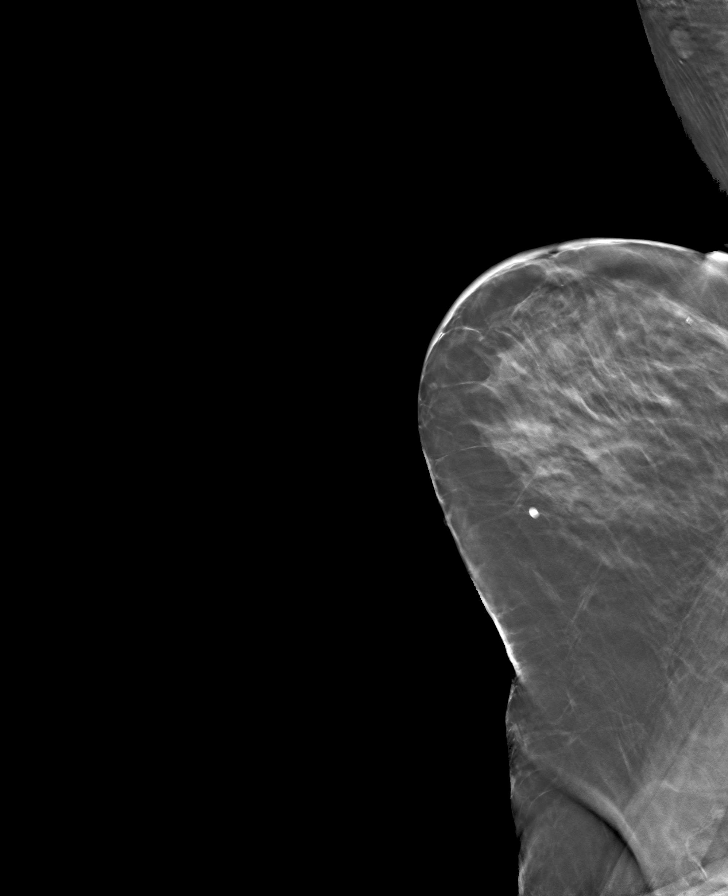

[R CC tomo · tomo slice 26/51.0]
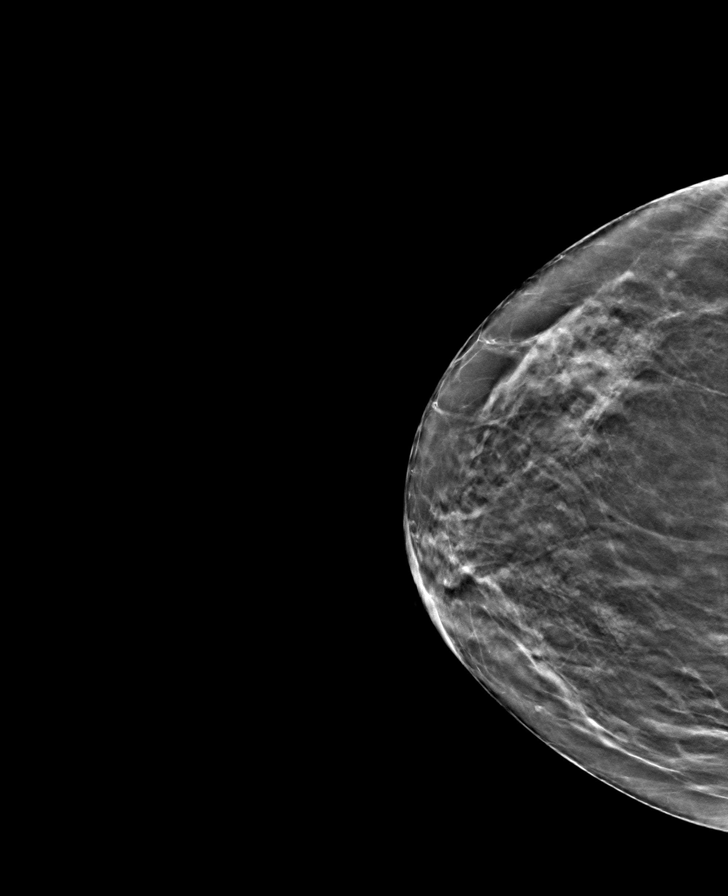

[R MLO tomo · tomo slice 29/57.0]
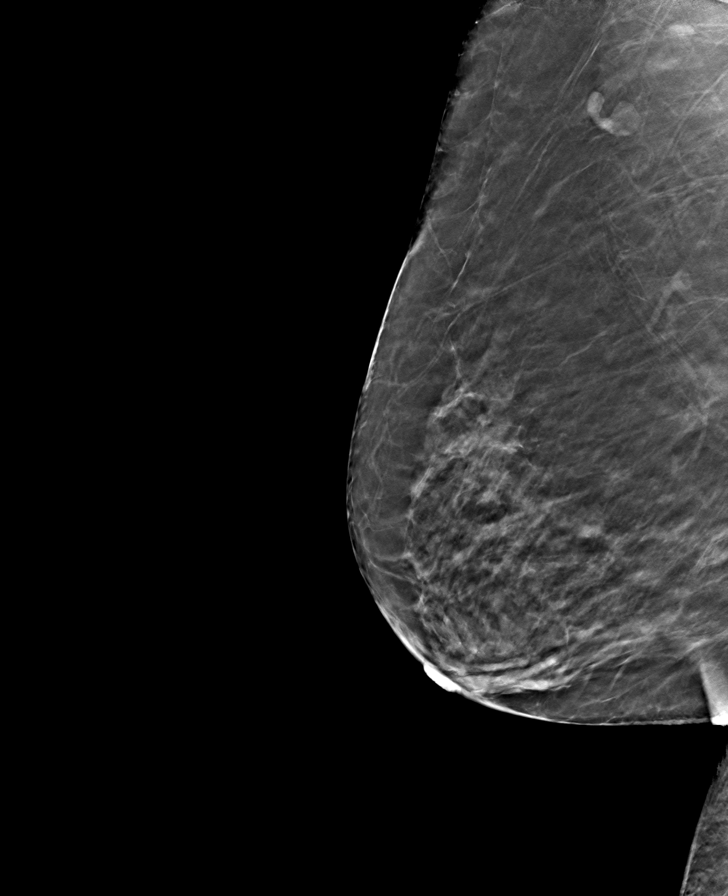

[8 of 24 positions shown; findings below may reference images not displayed]

ACR Breast Density Category c: The breast tissue is heterogeneously
dense, which may obscure small masses.
FINDINGS: There are no findings suspicious for malignancy.
IMPRESSION: No mammographic evidence of malignancy. Stable posttreatment changes
in the left breast. A result letter of this screening mammogram will
be mailed directly to the patient.

RECOMMENDATION:
Screening mammogram in one year. (Code:[id number])

BI-RADS CATEGORY  1: Negative.

## 2022-06-17 DIAGNOSIS — H401122 Primary open-angle glaucoma, left eye, moderate stage: Secondary | ICD-10-CM | POA: Diagnosis not present

## 2022-06-17 DIAGNOSIS — H401113 Primary open-angle glaucoma, right eye, severe stage: Secondary | ICD-10-CM | POA: Diagnosis not present

## 2022-06-22 ENCOUNTER — Encounter: Payer: Self-pay | Admitting: Family Medicine

## 2022-06-22 ENCOUNTER — Ambulatory Visit (INDEPENDENT_AMBULATORY_CARE_PROVIDER_SITE_OTHER): Payer: Medicare PPO

## 2022-06-22 DIAGNOSIS — Z23 Encounter for immunization: Secondary | ICD-10-CM | POA: Diagnosis not present

## 2022-06-22 NOTE — Progress Notes (Signed)
Per orders of Arlester Marker, MD, injection  flu shot given by Armandina Gemma, cma.  Patient tolerated injection well.

## 2022-07-12 ENCOUNTER — Other Ambulatory Visit: Payer: Self-pay | Admitting: Family Medicine

## 2022-07-12 DIAGNOSIS — I4811 Longstanding persistent atrial fibrillation: Secondary | ICD-10-CM

## 2022-07-27 ENCOUNTER — Encounter: Payer: Self-pay | Admitting: Family Medicine

## 2022-07-27 ENCOUNTER — Ambulatory Visit: Payer: Medicare PPO | Admitting: Family Medicine

## 2022-07-27 VITALS — BP 130/74 | HR 78 | Temp 97.4°F | Ht 65.0 in | Wt 160.2 lb

## 2022-07-27 DIAGNOSIS — L821 Other seborrheic keratosis: Secondary | ICD-10-CM | POA: Diagnosis not present

## 2022-07-27 NOTE — Progress Notes (Signed)
[facility] [address] [place] [address] Dept: [phone] Dept Fax: [phone]  Office Visit  Subjective:    Patient ID: Nicole Guzman, female    DOB: Sep 08, 1941, 81 y.o..   MRN: [id number]  Chief Complaint  Patient presents with   Acute Visit    C/o having a mole under LT breast that has changed.      History of Present Illness:  Patient is in today for having noted a skin lesion/mole on her left chest had broken apart recently. She denies any pain or irritation associated with this. Nicole Guzman notes that she has quite a few moles that she had had monitored over time.  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     COMBIGAN 0.2-0.5 % ophthalmic solution Apply 1 drop to eye 2 (two) times daily.     denosumab (PROLIA) 60 MG/ML SOSY injection Inject 60 mg into the skin every 6 (six) months. 180 mL 0   dorzolamide (TRUSOPT) 2 % ophthalmic solution Place 1 drop into the right eye 3 (three) times daily.     metoprolol tartrate (LOPRESSOR) 25 MG tablet Take 1 tablet by mouth twice daily 180 tablet 0    Multiple Vitamins-Minerals (MULTIVITAMIN WITH MINERALS) tablet Take 1 tablet by mouth daily.     Travoprost, BAK Free, (TRAVATAN) 0.004 % SOLN ophthalmic solution INSTILL 1 DROP INTO EACH EYE EVERY DAY AT BEDTIME     vitamin C (ASCORBIC ACID) 500 MG tablet Take 500 mg by mouth daily.     Vitamin D, Cholecalciferol, 25 MCG (1000 UT) TABS Take by mouth.     XARELTO 20 MG TABS tablet Take 1 tablet (20 mg total) by mouth daily. 30 tablet 11   No facility-administered medications prior to visit.   No Known Allergies    Objective:   Today's Vitals   07/27/22 1004  BP: 130/74  Pulse: 78  Temp: (!) 97.4 F (36.3 C)  TempSrc: Temporal  SpO2: 98%  Weight: 160 lb 3.2 oz (72.7 kg)  Height: '5\' 5"'$  (1.651 m)   Body mass index is 26.66 kg/m.   General: Well developed, well nourished. No acute distress. Skin: Warm and dry. There are numerous raised plaques with varying degrees of brown discoloration   on the trunk, esp. int he area under the breasts and over the xiphoid. One large lesion has several   fissures through the lesion. No sign of active bleeding present. Psych: Alert and oriented. Normal mood and affect.  Health Maintenance Due  Topic Date Due   Hepatitis C Screening  Never done   TETANUS/TDAP  Never  done   Pneumonia Vaccine 35+ Years old (1 - PCV) Never done     Assessment & Plan:   1. Seborrheic keratoses I reassured Ms. Cappelletti that these lesions are consistent with benign SKs. Because of the fissuring, this may need to be removed. I will refer her to dermatology to discuss.  - Ambulatory referral to Dermatology   Return if symptoms worsen or fail to improve.   [name], MD

## 2022-07-29 DIAGNOSIS — H401113 Primary open-angle glaucoma, right eye, severe stage: Secondary | ICD-10-CM | POA: Diagnosis not present

## 2022-07-29 DIAGNOSIS — H401122 Primary open-angle glaucoma, left eye, moderate stage: Secondary | ICD-10-CM | POA: Diagnosis not present

## 2022-08-17 ENCOUNTER — Encounter: Payer: Self-pay | Admitting: Family Medicine

## 2022-08-17 ENCOUNTER — Ambulatory Visit: Payer: Medicare PPO | Admitting: Family Medicine

## 2022-08-17 VITALS — BP 118/72 | HR 72 | Temp 97.8°F | Ht 65.0 in | Wt 160.8 lb

## 2022-08-17 DIAGNOSIS — E559 Vitamin D deficiency, unspecified: Secondary | ICD-10-CM

## 2022-08-17 DIAGNOSIS — E785 Hyperlipidemia, unspecified: Secondary | ICD-10-CM

## 2022-08-17 DIAGNOSIS — T386X5A Adverse effect of antigonadotrophins, antiestrogens, antiandrogens, not elsewhere classified, initial encounter: Secondary | ICD-10-CM

## 2022-08-17 DIAGNOSIS — M818 Other osteoporosis without current pathological fracture: Secondary | ICD-10-CM

## 2022-08-17 DIAGNOSIS — Z1211 Encounter for screening for malignant neoplasm of colon: Secondary | ICD-10-CM

## 2022-08-17 DIAGNOSIS — I4811 Longstanding persistent atrial fibrillation: Secondary | ICD-10-CM | POA: Diagnosis not present

## 2022-08-17 DIAGNOSIS — Z1159 Encounter for screening for other viral diseases: Secondary | ICD-10-CM

## 2022-08-17 NOTE — Progress Notes (Signed)
[facility] [address] [place] [address] Dept: [phone] Dept Fax: [phone]  Chronic Care Office Visit  Subjective:    Patient ID: Nicole Guzman, female    DOB: Oct 11, 1940, 81 y.o..   MRN: [id number]  Chief Complaint  Patient presents with   Follow-up    6 month f/u.  No concerns.  Not fasting today     History of Present Illness:  Patient is in today for reassessment of chronic medical issues.  [name] has a history of atrial fibrillation. She is on Xarelto 20 mg daily. She has not experienced any palpitations and feels this is doing well overall.   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 She is currently on Prolia and is no longer on her AI. She is taking her calcium/Vit. D and a separate Vitamin D supplement. Her last DEXA scan was 12 /21/2021.  [name] has a history of a borderline hyperlipidemia. She is not on medication for this.  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     COMBIGAN 0.2-0.5 % ophthalmic solution Apply 1 drop to eye  2 (two) times daily.     denosumab (PROLIA) 60 MG/ML SOSY injection Inject 60 mg into the skin every 6 (six) months. 180 mL 0   dorzolamide (TRUSOPT) 2 % ophthalmic solution Place 1 drop into the right eye 3 (three) times daily.     metoprolol tartrate (LOPRESSOR) 25 MG tablet Take 1 tablet by mouth twice daily 180 tablet 0   Multiple Vitamins-Minerals (MULTIVITAMIN WITH MINERALS) tablet Take 1 tablet by mouth daily.     Travoprost, BAK Free, (TRAVATAN) 0.004 % SOLN ophthalmic solution INSTILL 1 DROP INTO EACH EYE EVERY DAY AT BEDTIME     vitamin C (ASCORBIC ACID) 500 MG tablet Take 500 mg by mouth daily.     Vitamin D, Cholecalciferol, 25 MCG (1000 UT) TABS Take by mouth.     XARELTO 20 MG TABS tablet Take 1 tablet (20 mg total) by mouth daily. 30 tablet 11   No facility-administered medications prior to visit.   No Known Allergies    Objective:   Today's Vitals   08/17/22 0854  BP: 118/72  Pulse: 72  Temp: 97.8 F (36.6 C)  TempSrc: Temporal  SpO2: 99%  Weight: 160 lb 12.8 oz (72.9 kg)  Height: '5\' 5"'$  (1.651 m)   Body mass index is 26.76 kg/m.   General: Well developed, well nourished. No acute distress. Psych: Alert and oriented. Normal mood and affect.  Health Maintenance Due  Topic Date Due   Hepatitis  C Screening  Never done   Pneumonia Vaccine 32+ Years old (1 - PCV) Never done     Assessment & Plan:   1. Longstanding persistent atrial fibrillation (HCC) Stable. Continue metoprolol 25 mg twice daily.  2. 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 No longer on an aromatase inhibitor. I will order a bone density to assess her status.  - DG Bone Density; Future  3. Borderline hyperlipidemia Blood pressure in good control. Continue metoprolol 25 mg twice daily. - Lipid panel; Future  4. Vitamin D deficiency History of Vitamin D deficiency. I will reassess her current status.  - VITAMIN D 25 Hydroxy (Vit-D Deficiency, Fractures);  Future  5. Encounter for hepatitis C screening test for low risk patient  - HCV Ab w Reflex to Quant PCR; Future  6. Screening for colon cancer  - Cologuard   Return in about 6 months (around 02/15/2023) for Reassessment.   [name], MD

## 2022-08-24 ENCOUNTER — Other Ambulatory Visit: Payer: Medicare PPO

## 2022-09-07 DIAGNOSIS — Z1211 Encounter for screening for malignant neoplasm of colon: Secondary | ICD-10-CM | POA: Diagnosis not present

## 2022-09-08 ENCOUNTER — Telehealth: Payer: Self-pay | Admitting: Family Medicine

## 2022-09-08 DIAGNOSIS — M818 Other osteoporosis without current pathological fracture: Secondary | ICD-10-CM

## 2022-09-08 NOTE — Telephone Encounter (Signed)
Pt is getting her Prolia shot on 10/06/21. She needs to know if she should do anything for this app. Last time she had to pick it up from her pharmacy for the first time. Please advise pt at [phone]

## 2022-09-13 ENCOUNTER — Other Ambulatory Visit: Payer: Self-pay | Admitting: Family Medicine

## 2022-09-13 DIAGNOSIS — I4811 Longstanding persistent atrial fibrillation: Secondary | ICD-10-CM

## 2022-09-14 LAB — COLOGUARD: COLOGUARD: NEGATIVE

## 2022-09-23 NOTE — Telephone Encounter (Signed)
Good morning is there a way to do PA for Prolia also needing to know if/when approved if patient will need to pick up from her pharmacy or would this be able to be sent to [facility] to be mailed to our office?

## 2022-09-24 ENCOUNTER — Other Ambulatory Visit (HOSPITAL_COMMUNITY): Payer: Self-pay

## 2022-09-24 ENCOUNTER — Ambulatory Visit (HOSPITAL_BASED_OUTPATIENT_CLINIC_OR_DEPARTMENT_OTHER)
Admission: RE | Admit: 2022-09-24 | Discharge: 2022-09-24 | Disposition: A | Discharge status: Home / Self Care | Payer: Medicare PPO | Source: Ambulatory Visit | Attending: Family Medicine | Admitting: Family Medicine

## 2022-09-24 DIAGNOSIS — M818 Other osteoporosis without current pathological fracture: Secondary | ICD-10-CM | POA: Diagnosis not present

## 2022-09-24 DIAGNOSIS — M85852 Other specified disorders of bone density and structure, left thigh: Secondary | ICD-10-CM | POA: Diagnosis not present

## 2022-09-24 DIAGNOSIS — T386X5A Adverse effect of antigonadotrophins, antiestrogens, antiandrogens, not elsewhere classified, initial encounter: Secondary | ICD-10-CM | POA: Insufficient documentation

## 2022-09-24 DIAGNOSIS — M85832 Other specified disorders of bone density and structure, left forearm: Secondary | ICD-10-CM | POA: Diagnosis not present

## 2022-09-24 NOTE — Telephone Encounter (Signed)
Pt ready for scheduling on or after 09/24/22  Out-of-pocket cost due at time of visit: $0  Primary: [employer] Prolia co-insurance: 0% Admin fee co-insurance: 0%  Secondary:  Prolia co-insurance:  Admin fee co-insurance:   Deductible:   Prior Auth:  PA# [id number]  Valid: 09/24/22-09/28/23  ** This summary of benefits is an estimation of the patient's out-of-pocket cost. Exact cost may vary based on individual plan coverage.

## 2022-09-24 NOTE — Telephone Encounter (Signed)
Pharmacy Patient Advocate Encounter   Received notification that prior authorization for Prolia '60MG'$ /ML syringes is required/requested.  Per Test Claim: $55.00 copay with pharmacy benefit   PA submitted on 09/24/22 to (ins) [place] via CoverMyMeds Key [id number]  Status is pending

## 2022-09-24 NOTE — Telephone Encounter (Signed)
Pharmacy Patient Advocate Encounter  Insurance verification completed.    The patient is insured through [employer] for: Prolia '60mg'$ .  Pharmacy benefit copay: $55.00

## 2022-09-24 NOTE — Telephone Encounter (Signed)
Patient Advocate Encounter  Prior Authorization for Prolia '60MG'$ /ML syringes has been approved.    PA# [id number] Key: [id number]   Effective dates: 09/24/22 through 09/28/23

## 2022-10-01 ENCOUNTER — Other Ambulatory Visit: Payer: Self-pay

## 2022-10-01 ENCOUNTER — Other Ambulatory Visit (HOSPITAL_COMMUNITY): Payer: Self-pay

## 2022-10-01 MED ORDER — DENOSUMAB 60 MG/ML ~~LOC~~ SOSY
60.0000 mg | PREFILLED_SYRINGE | SUBCUTANEOUS | 0 refills | Status: DC
  Filled 2022-10-01: qty 180, fill #0
  Filled 2022-10-01: qty 1, 180d supply, fill #0

## 2022-10-01 NOTE — Addendum Note (Signed)
Addended by: Lynda Rainwater on: 10/01/2022 10:09 AM   Modules accepted: Orders

## 2022-10-01 NOTE — Telephone Encounter (Signed)
Patient aware of $25 co-pay at time of visit and $55 pharmacy fee for injection that will be paid to pharmacy. Patient agrees to keep appointment.

## 2022-10-02 ENCOUNTER — Other Ambulatory Visit (HOSPITAL_COMMUNITY): Payer: Self-pay

## 2022-10-06 ENCOUNTER — Ambulatory Visit (INDEPENDENT_AMBULATORY_CARE_PROVIDER_SITE_OTHER): Payer: Medicare PPO

## 2022-10-06 DIAGNOSIS — T386X5A Adverse effect of antigonadotrophins, antiestrogens, antiandrogens, not elsewhere classified, initial encounter: Secondary | ICD-10-CM

## 2022-10-06 DIAGNOSIS — M818 Other osteoporosis without current pathological fracture: Secondary | ICD-10-CM

## 2022-10-06 DIAGNOSIS — E559 Vitamin D deficiency, unspecified: Secondary | ICD-10-CM

## 2022-10-06 MED ORDER — DENOSUMAB 60 MG/ML ~~LOC~~ SOSY
60.0000 mg | PREFILLED_SYRINGE | Freq: Once | SUBCUTANEOUS | Status: AC
  Administered 2022-10-06: 60 mg via SUBCUTANEOUS

## 2022-10-06 NOTE — Progress Notes (Signed)
Per orders of Arlester Marker, injection of Prolia given by [employer], cma.  Patient tolerated injection well.   [place] # [id number] LOT# [id number] Exp  12/26/2024  Given in Rt arm (SQ).  Dm/cma

## 2022-10-16 ENCOUNTER — Inpatient Hospital Stay: Payer: Medicare PPO | Admitting: Hematology & Oncology

## 2022-10-16 ENCOUNTER — Inpatient Hospital Stay: Payer: Medicare PPO

## 2022-10-26 ENCOUNTER — Other Ambulatory Visit (HOSPITAL_COMMUNITY): Payer: Self-pay

## 2022-10-29 ENCOUNTER — Other Ambulatory Visit (HOSPITAL_COMMUNITY): Payer: Self-pay

## 2022-11-09 DIAGNOSIS — H401122 Primary open-angle glaucoma, left eye, moderate stage: Secondary | ICD-10-CM | POA: Diagnosis not present

## 2022-11-09 DIAGNOSIS — H25812 Combined forms of age-related cataract, left eye: Secondary | ICD-10-CM | POA: Diagnosis not present

## 2022-11-09 DIAGNOSIS — H401113 Primary open-angle glaucoma, right eye, severe stage: Secondary | ICD-10-CM | POA: Diagnosis not present

## 2022-11-09 DIAGNOSIS — H25813 Combined forms of age-related cataract, bilateral: Secondary | ICD-10-CM | POA: Diagnosis not present

## 2022-11-09 DIAGNOSIS — H25811 Combined forms of age-related cataract, right eye: Secondary | ICD-10-CM | POA: Diagnosis not present

## 2022-11-22 DIAGNOSIS — Z7901 Long term (current) use of anticoagulants: Secondary | ICD-10-CM | POA: Diagnosis not present

## 2022-11-22 DIAGNOSIS — R0789 Other chest pain: Secondary | ICD-10-CM | POA: Diagnosis not present

## 2022-11-22 DIAGNOSIS — R072 Precordial pain: Secondary | ICD-10-CM | POA: Diagnosis not present

## 2022-11-22 DIAGNOSIS — R609 Edema, unspecified: Secondary | ICD-10-CM | POA: Diagnosis not present

## 2022-11-22 DIAGNOSIS — I10 Essential (primary) hypertension: Secondary | ICD-10-CM | POA: Diagnosis not present

## 2022-11-22 DIAGNOSIS — I700 Atherosclerosis of aorta: Secondary | ICD-10-CM | POA: Diagnosis not present

## 2022-11-22 DIAGNOSIS — R079 Chest pain, unspecified: Secondary | ICD-10-CM | POA: Diagnosis not present

## 2022-11-22 DIAGNOSIS — I4891 Unspecified atrial fibrillation: Secondary | ICD-10-CM | POA: Diagnosis not present

## 2022-11-22 DIAGNOSIS — J9811 Atelectasis: Secondary | ICD-10-CM | POA: Diagnosis not present

## 2022-11-22 DIAGNOSIS — R0600 Dyspnea, unspecified: Secondary | ICD-10-CM | POA: Diagnosis not present

## 2022-11-22 DIAGNOSIS — R0602 Shortness of breath: Secondary | ICD-10-CM | POA: Diagnosis not present

## 2022-11-22 DIAGNOSIS — R918 Other nonspecific abnormal finding of lung field: Secondary | ICD-10-CM | POA: Diagnosis not present

## 2022-11-23 ENCOUNTER — Telehealth: Payer: Self-pay

## 2022-11-23 DIAGNOSIS — I4891 Unspecified atrial fibrillation: Secondary | ICD-10-CM | POA: Diagnosis not present

## 2022-11-23 NOTE — Transitions of Care (Post Inpatient/ED Visit) (Signed)
   11/23/2022  Name: Nicole Guzman MRN: [id number] DOB: [birth year]  Today's TOC FU Call Status: Today's TOC FU Call Status:: Successful TOC FU Call Competed TOC FU Call Complete Date: 11/23/22  Transition Care Management Follow-up Telephone Call Date of Discharge: 11/22/22 Discharge Facility: Other ([facility]) Name of Other (Non[facility]) Discharge Facility: [facility] How have you been since you were released from the hospital?: Better Any questions or concerns?: No  Items Reviewed: Did you receive and understand the discharge instructions provided?: No Medications Not Reviewed Reasons:: Other: (none ordered) Any new allergies since your discharge?: No Dietary orders reviewed?: No Do you have support at home?: Yes People in Home: spouse  Home Care and Equipment/Supplies: [place] Ordered?: NA Any new equipment or medical supplies ordered?: NA  Functional Questionnaire: Do you need assistance with bathing/showering or dressing?: Yes Do you need assistance with meal preparation?: Yes Do you need assistance with eating?: Yes Do you have difficulty maintaining continence: Yes Do you need assistance with getting out of bed/getting out of a chair/moving?: Yes Do you have difficulty managing or taking your medications?: Yes  Folllow up appointments reviewed: PCP Follow-up appointment confirmed?: Yes Date of PCP follow-up appointment?: 11/25/22 Follow-up Provider: [facility] Follow-up appointment confirmed?: NA Do you need transportation to your follow-up appointment?: No Do you understand care options if your condition(s) worsen?: Yes-patient verbalized understanding  SDOH Interventions Today    Flowsheet Row Most Recent Value  SDOH Interventions   Physical Activity Interventions Patient Refused       SIGNATURE Angeline Slim, RN, BSN

## 2022-11-25 ENCOUNTER — Encounter: Payer: Self-pay | Admitting: Family Medicine

## 2022-11-25 ENCOUNTER — Ambulatory Visit: Payer: Medicare PPO | Admitting: Family Medicine

## 2022-11-25 VITALS — BP 122/74 | HR 94 | Temp 98.2°F | Ht 65.0 in | Wt 158.0 lb

## 2022-11-25 DIAGNOSIS — R0789 Other chest pain: Secondary | ICD-10-CM

## 2022-11-25 DIAGNOSIS — I4811 Longstanding persistent atrial fibrillation: Secondary | ICD-10-CM | POA: Diagnosis not present

## 2022-11-25 NOTE — Assessment & Plan Note (Signed)
Nicole Guzman had an extensive work-up through the ED. Her chest pain appears to be non-cardiac. On exam today, I could hear some gastric sounds over the lower mid chest. I wonder if she might have a hiatal hernia that produced the chest symptoms after she ate. We discussed eating smaller meals and  not lying down too soon afterwards. she should follow-up if she has any worsening issues.

## 2022-11-25 NOTE — Assessment & Plan Note (Signed)
Stable. Rate well controlled. Continue metoprolol tartrate 25 mg daily and Xarelto 20 mg daily.

## 2022-11-25 NOTE — Progress Notes (Signed)
[facility] [address] [place] [address] Dept: [phone] Dept Fax: [phone]  Office Visit  Subjective:    Patient ID: Nicole Guzman, female    DOB: 09/23/1941, 82 y.o..   MRN: [id number]  Chief Complaint  Patient presents with   Hospitalization [facility] f/u from 11/02/22. C/o having chest pain last couple days on/off.    History of Present Illness:  Patient is in today for follow-up from a recent ED visit. She was seen at [facility] on 11/22/2022 with chest pain. Ms. Clifft notes she had eaten dinner that evening. Afterwards, she noted a pain across her mid chest. As this was not improving, her son came over and checked on her. He found he blood pressure to be quite high (systolic in [id number]). He then had her go to the ED. She notes she ahd extensive work-up and no cause for her pain was determined, but she was cleared as to having any cardiac cause for this. Since returning home, she has had similar issues int he evenings that last for a short while and then resolve. She has not noted any significant dyspnea, fever, or N/V. Her lower leg swelling has been stable.  Ms. Henehan husband is currently under Hospice care and having rapid deterioration of his health. She has been sleeping sitting up on the cough to be near him in his recliner.  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     COMBIGAN 0.2-0.5 % ophthalmic solution Apply 1 drop to eye 2 (two) times daily.     denosumab (PROLIA) 60 MG/ML SOSY injection Inject 60 mg into the skin every 6 (six) months. 1 mL 0   dextromethorphan-guaiFENesin (MUCINEX DM) 30-600 MG 12hr tablet Take 1 tablet by mouth 2 (two) times daily as needed for cough.     dorzolamide (TRUSOPT) 2 % ophthalmic solution Place 1 drop into the right eye 3 (three) times daily.     metoprolol tartrate (LOPRESSOR) 25 MG tablet Take 1 tablet by mouth twice daily 180 tablet 0   Multiple Vitamins-Minerals (MULTIVITAMIN WITH MINERALS) tablet Take 1 tablet by mouth daily.     Travoprost, BAK Free, (TRAVATAN) 0.004 % SOLN ophthalmic solution INSTILL 1 DROP INTO EACH EYE EVERY DAY AT BEDTIME     vitamin C (ASCORBIC ACID) 500 MG tablet Take 500 mg by mouth daily.     Vitamin D, Cholecalciferol, 25 MCG (1000 UT) TABS Take by mouth.     XARELTO 20 MG TABS tablet Take 1 tablet by mouth once daily 30 tablet 11   No facility-administered medications  prior to visit.   No Known Allergies   Objective:   Today's Vitals   11/25/22 1054  BP: 122/74  Pulse: 94  Temp: 98.2 F (36.8 C)  TempSrc: Temporal  SpO2: 97%  Weight: 158 lb (71.7 kg)  Height: '5\' 5"'$  (1.651 m)   Body mass index is 26.29 kg/m.   General: Well developed, well nourished. No acute distress. HEENT: Normocephalic, non-traumatic. Conjunctiva clear. External ears normal. EAC and TMs normal   bilaterally. Nose clear without congestion or rhinorrhea. Mucous membranes moist. Oropharynx clear. Good   dentition. Neck: Supple. No lymphadenopathy. No thyromegaly. Lungs: Clear to auscultation  bilaterally. No wheezing, rales or rhonchi. CV: RRR without murmurs or rubs. Pulses 2+ bilaterally. Abdomen: Soft, non-tender. Bowel sounds positive, normal pitch and frequency. No hepatosplenomegaly. No   rebound or guarding. Extremities:1+ edema noted bilaterally. Psych: Alert and oriented. Normal mood and affect.  Health Maintenance Due  Topic Date Due   Hepatitis C Screening  Never done   DTaP/Tdap/Td (1 - Tdap) Never done   Pneumonia Vaccine 80+ Years old (1 of 1 - PCV) Never done   Imaging: Chest x-ray (11/22/2022) IMPRESSION: There appears to be a lateral left lung retrocardiac opacity. This could represent atelectasis or pneumonia.   POCUS Cardiac (11/22/2022) Interpretation:          Comments:  Mild to moderate MR and TR, velocities not supportive of critical aortic stenosis. Normal TAPSE  CT Angiography Chest w contrast (11/22/2022) IMPRESSION: 1. No evidence of pulmonary embolism or other acute intrathoracic process. 2. 3 mm noncalcified right upper lobe lung nodule. No follow-up needed if patient is low-risk.This recommendation follows the consensus statement: Guidelines for Management of Incidental Pulmonary Nodules Detected on CT Images: From the Fleischner Society 2017; Radiology 2017; 284:228-243. 3. Mild right middle lobe and mild bibasilar atelectasis. 4. Pectus deformity. 5. Aortic atherosclerosis.   Lab Results Troponin I (11/22/2022 6:46 PM EST) Lab Results - Troponin I (11/22/2022 6:46 PM EST) Component Value Ref Range Test Method Analysis Time Performed At Pathologist [facility]  Troponin I 4 <18 pg/mL   11/22/2022 7:44 PM EST [facility]     Component Value Ref Range Test Method Analysis Time Performed At [facility]ologist [facility]  Troponin I 4 <18 pg/mL   11/22/2022 5:41 PM EST [facility]     (ABNORMAL) CBC and Differential (11/22/2022 5:00 PM EST) Lab Results - (ABNORMAL) CBC and Differential (11/22/2022 5:00 PM EST) Component  Value Ref Range Test Method Analysis Time Performed At [facility]ologist [facility]  WBC 11.1 (H) 4.4 - 11.0 x 10*3/uL   11/22/2022 5:20 PM EST [facility]    RBC 4.04 (L) 4.10 - 5.10 x 10*6/uL   11/22/2022 5:20 PM EST [facility]    Hemoglobin 12.9 12.3 - 15.3 G/DL   11/22/2022 5:20 PM EST [facility]    Hematocrit 39.3 35.9 - 44.6 %   11/22/2022 5:20 PM EST [facility]    MCV 97.1 (H) 80.0 - 96.0 FL   11/22/2022 5:20 PM EST [facility]    MCH 31.9 27.5 - 33.2 PG   11/22/2022 5:20 PM EST [facility]    MCHC 32.8 (L) 33.0 - 37.0 G/DL   11/22/2022 5:20 PM EST [facility]    RDW 14.9 12.3 - 17.0 %   11/22/2022 5:20 PM EST [facility]    Platelets 203 150 - 450 X  10*3/uL   11/22/2022 5:20 PM EST [facility]    MPV 10.0 6.8 - 10.2 FL   11/22/2022 5:20 PM EST [facility]    Neutrophil % 78 %   11/22/2022 5:20 PM EST [facility]    Lymphocyte % 13 %   11/22/2022 5:20 PM EST [facility]    Monocyte % 8 %   11/22/2022 5:20 PM EST [facility]    Eosinophil % 1 %   11/22/2022 5:20 PM EST [facility]    Basophil % 0 %   11/22/2022 5:20 PM EST [facility]    Neutrophil Absolute 8.7 (H) 1.8 - 7.8 x 10*3/uL   11/22/2022 5:20 PM EST [facility]    Lymphocyte Absolute 1.5 1.0 - 4.8 x 10*3/uL   11/22/2022 5:20 PM EST [facility]    Monocyte Absolute 0.9 (H) 0.0 - 0.8 x 10*3/uL   11/22/2022 5:20 PM EST [facility]    Eosinophil Absolute 0.1 0.0 - 0.5 x 10*3/uL   11/22/2022 5:20 PM EST [facility]    Basophil Absolute 0.0 0.0 - 0.2 x 10*3/uL   11/22/2022 5:20 PM EST [facility]     (ABNORMAL) B Type Natriuretic Peptide (11/22/2022 5:00 PM EST) Lab Results - (ABNORMAL) B Type Natriuretic Peptide (11/22/2022 5:00 PM EST) Component Value Ref  Range Test Method Analysis Time Performed At Pathologist [facility]  BNP 308 (H) 5 - 100 PG/ML   11/22/2022 5:40 PM EST [facility]        Assessment & Plan:   Problem List Items Addressed This Visit       Cardiovascular and Mediastinum   Atrial fibrillation ([place])    Stable. Rate well controlled. Continue metoprolol tartrate 25 mg daily and Xarelto 20 mg daily.        Other   Other chest pain - Primary    Ms. Stadtler had an extensive work-up through the ED. Her chest pain appears to be non-cardiac. On exam today, I could hear some gastric sounds over the lower mid chest. I wonder if she might have a hiatal hernia that produced the chest symptoms after she ate. We discussed eating smaller meals and  not lying down too soon afterwards. she should follow-up if she has any worsening issues.       Return in about 3 months (around 02/23/2023) for Reassessment.   [name], MD

## 2022-12-27 ENCOUNTER — Other Ambulatory Visit: Payer: Self-pay | Admitting: Family Medicine

## 2022-12-27 DIAGNOSIS — I4811 Longstanding persistent atrial fibrillation: Secondary | ICD-10-CM

## 2023-02-08 DIAGNOSIS — H25811 Combined forms of age-related cataract, right eye: Secondary | ICD-10-CM | POA: Diagnosis not present

## 2023-02-08 DIAGNOSIS — H25813 Combined forms of age-related cataract, bilateral: Secondary | ICD-10-CM | POA: Diagnosis not present

## 2023-02-08 DIAGNOSIS — H25812 Combined forms of age-related cataract, left eye: Secondary | ICD-10-CM | POA: Diagnosis not present

## 2023-02-08 DIAGNOSIS — H401113 Primary open-angle glaucoma, right eye, severe stage: Secondary | ICD-10-CM | POA: Diagnosis not present

## 2023-02-08 DIAGNOSIS — H401122 Primary open-angle glaucoma, left eye, moderate stage: Secondary | ICD-10-CM | POA: Diagnosis not present

## 2023-02-17 ENCOUNTER — Ambulatory Visit: Payer: Medicare PPO | Admitting: Family Medicine

## 2023-02-17 VITALS — BP 134/80 | HR 65 | Temp 97.4°F | Ht 65.0 in | Wt 156.6 lb

## 2023-02-17 DIAGNOSIS — T386X5A Adverse effect of antigonadotrophins, antiestrogens, antiandrogens, not elsewhere classified, initial encounter: Secondary | ICD-10-CM | POA: Diagnosis not present

## 2023-02-17 DIAGNOSIS — M818 Other osteoporosis without current pathological fracture: Secondary | ICD-10-CM | POA: Diagnosis not present

## 2023-02-17 DIAGNOSIS — I4811 Longstanding persistent atrial fibrillation: Secondary | ICD-10-CM

## 2023-02-17 DIAGNOSIS — Z1159 Encounter for screening for other viral diseases: Secondary | ICD-10-CM | POA: Diagnosis not present

## 2023-02-17 DIAGNOSIS — E785 Hyperlipidemia, unspecified: Secondary | ICD-10-CM | POA: Diagnosis not present

## 2023-02-17 LAB — LIPID PANEL
Cholesterol: 207 mg/dL — ABNORMAL HIGH (ref 0–200)
HDL: 75.3 mg/dL (ref >39.00)
LDL Cholesterol: 112 mg/dL — ABNORMAL HIGH (ref 0–99)
NonHDL: 131.76
Total CHOL/HDL Ratio: 3
Triglycerides: 99 mg/dL (ref 0.0–149.0)
VLDL: 19.8 mg/dL (ref 0.0–40.0)

## 2023-02-17 NOTE — Assessment & Plan Note (Signed)
We will check lipids today. 

## 2023-02-17 NOTE — Progress Notes (Signed)
[facility] [address] [place] [address] Dept: [phone] Dept Fax: [phone]  Chronic Care Office Visit  Subjective:    Patient ID: Nicole Guzman, female    DOB: Dec 30, 1940, 82 y.o..   MRN: [id number]  Chief Complaint  Patient presents with   Medical Management of Chronic Issues    6 month f/u.  No concerns.  Fasting today.    History of Present Illness:  Patient is in today for reassessment of chronic medical issues.  Ms. Pertuit notes she is adjusting after the death of her husband this spring. She is doing walking in her neighborhood. She ahs some good and bad days related to her grief, but she feels she is coming to terms with this well. He family remains supportive.  Ms. Lovel has a history of atrial fibrillation. She is on Xarelto 20 mg daily. She has not experienced any palpitations and feels this is doing well overall.   Ms. 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 She is currently on Prolia and is no longer on her AI. She is taking her calcium/Vit. D and a separate Vitamin D supplement.   Ms. Bergan has a history of a borderline hyperlipidemia. She is not on medication for this.  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     COMBIGAN 0.2-0.5 % ophthalmic solution Apply 1 drop to eye 2 (two) times daily.     denosumab (PROLIA) 60 MG/ML SOSY injection Inject 60 mg into the skin every 6 (six) months. 1 mL 0   dorzolamide (TRUSOPT) 2 % ophthalmic solution Place 1 drop into the right eye 3 (three) times daily.     metoprolol tartrate (LOPRESSOR) 25 MG tablet Take 1 tablet by mouth twice daily 180 tablet 3   moxifloxacin (VIGAMOX) 0.5 % ophthalmic solution Insert one drop into the surgical eye 3 times a day starting 4 days prior to surgery.     Multiple Vitamins-Minerals (MULTIVITAMIN WITH MINERALS) tablet Take 1 tablet by mouth daily.     Travoprost, BAK Free, (TRAVATAN) 0.004 % SOLN ophthalmic solution INSTILL 1 DROP INTO EACH EYE EVERY DAY AT BEDTIME     vitamin C (ASCORBIC ACID) 500 MG tablet Take 500 mg by mouth daily.     Vitamin D, Cholecalciferol, 25 MCG (1000 UT) TABS Take by mouth.     XARELTO 20 MG TABS tablet Take 1 tablet by mouth once daily 30 tablet 11   dextromethorphan-guaiFENesin (MUCINEX DM) 30-600 MG 12hr tablet Take 1 tablet by mouth 2 (two) times daily as  needed for cough. (Patient not taking: Reported on 02/17/2023)     No facility-administered medications prior to visit.   No Known Allergies Objective:   Today's Vitals   02/17/23 0858  BP: 134/80  Pulse: 65  Temp: (!) 97.4 F (36.3 C)  TempSrc: Temporal  SpO2: 97%  Weight: 156 lb 9.6 oz (71 kg)  Height: 5\' 5"  (1.651 m)   Body mass index is 26.06 kg/m.   General: Well developed, well nourished. No acute distress. CV: RRR without murmurs or rubs. Pulses 2+ bilaterally. Extremities: No edema noted. Psych: Alert and oriented. Normal mood and affect.  Health Maintenance Due  Topic Date Due   Hepatitis C Screening   Never done   DTaP/Tdap/Td (1 - Tdap) Never done   Pneumonia Vaccine 65+ Years old (1 of 1 - PCV) Never done   Medicare Annual Wellness (AWV)  02/18/2023   Imaging: Bone Density (09/25/2023) ASSESSMENT: The BMD measured at Forearm Radius 33% is 0.701 g/cm2 with a T-score of -2.0. This patient is considered osteopenic according to World Health Organization [facility]) criteria. Lumbar spine was not utilized due to advanced degenerative changes.The scan quality is good. FRAX was not done due to BBM use.   Site Region Measured Date Measured Age WHO YA BMD Classification T-score DualFemur Neck Left 09/24/2022 79.5 years Low Bone Mass -1.2 0.866g/cm2   Left Forearm Radius 33% 09/24/2022 79.5 Low Bone Mass -2.0 0.701g/cm2    Assessment & Plan:   Problem List Items Addressed This Visit       Cardiovascular and Mediastinum   Atrial fibrillation ([facility]) - Primary    Stable. Rate well controlled. Continue metoprolol tartrate 25 mg twice a day and Xarelto 20 mg daily.        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 Last DXA showed values in an osteopenia range. Continue calcium and Vit D. Continue Prolia.        Other   Borderline hyperlipidemia    We will check lipids today.      Relevant Orders   Lipid panel   Other Visit Diagnoses     Encounter for hepatitis C screening test for low risk patient       Relevant Orders   HCV Ab w Reflex to Quant PCR       Return in about 6 months (around 08/20/2023) for Reassessment.   Loyola Mast, MD

## 2023-02-17 NOTE — Assessment & Plan Note (Signed)
Stable. Last DXA showed values in an osteopenia range. Continue calcium and Vit D. Continue Prolia.

## 2023-02-17 NOTE — Assessment & Plan Note (Signed)
Stable. Rate well controlled. Continue metoprolol tartrate 25 mg twice a day and Xarelto 20 mg daily.

## 2023-02-18 LAB — HCV INTERPRETATION

## 2023-02-18 LAB — HCV AB W REFLEX TO QUANT PCR: HCV Ab: NONREACTIVE

## 2023-02-24 ENCOUNTER — Telehealth: Payer: Self-pay | Admitting: Family Medicine

## 2023-02-25 NOTE — Telephone Encounter (Signed)
error 

## 2023-02-26 ENCOUNTER — Ambulatory Visit (INDEPENDENT_AMBULATORY_CARE_PROVIDER_SITE_OTHER): Payer: Medicare PPO

## 2023-02-26 VITALS — Ht 65.0 in | Wt 156.0 lb

## 2023-02-26 DIAGNOSIS — Z0000 Encounter for general adult medical examination without abnormal findings: Secondary | ICD-10-CM

## 2023-02-26 NOTE — Patient Instructions (Signed)
Nicole Guzman , Thank you for taking time to come for your Medicare Wellness Visit. I appreciate your ongoing commitment to your health goals. Please review the following plan we discussed and let me know if I can assist you in the future.   These are the goals we discussed:  Goals      Patient Stated     Eat healthier & start walking     Patient Stated     02/26/2023, wants to get cholesterol down        This is a list of the screening recommended for you and due dates:  Health Maintenance  Topic Date Due   DTaP/Tdap/Td vaccine (1 - Tdap) Never done   Pneumonia Vaccine (1 of 1 - PCV) Never done   Flu Shot  04/29/2023   Medicare Annual Wellness Visit  02/26/2024   DEXA scan (bone density measurement)  Completed   Hepatitis C Screening  Completed   Zoster (Shingles) Vaccine  Completed   HPV Vaccine  Aged Out   COVID-19 Vaccine  Discontinued    Advanced directives: Please bring a copy of your POA (Power of [place]) and/or Living Will to your next appointment.   Conditions/risks identified: none  Next appointment: Follow up in one year for your annual wellness visit    Preventive Care 65 Years and Older, Female Preventive care refers to lifestyle choices and visits with your health care provider that can promote health and wellness. What does preventive care include? A yearly physical exam. This is also called an annual well check. Dental exams once or twice a year. Routine eye exams. Ask your health care provider how often you should have your eyes checked. Personal lifestyle choices, including: Daily care of your teeth and gums. Regular physical activity. Eating a healthy diet. Avoiding tobacco and drug use. Limiting alcohol use. Practicing safe sex. Taking low-dose aspirin every day. Taking vitamin and mineral supplements as recommended by your health care provider. What happens during an annual well check? The services and screenings done by your health care  provider during your annual well check will depend on your age, overall health, lifestyle risk factors, and family history of disease. Counseling  Your health care provider may ask you questions about your: Alcohol use. Tobacco use. Drug use. Emotional well-being. Home and relationship well-being. Sexual activity. Eating habits. History of falls. Memory and ability to understand (cognition). Work and work Astronomer. Reproductive health. Screening  You may have the following tests or measurements: Height, weight, and BMI. Blood pressure. Lipid and cholesterol levels. These may be checked every 5 years, or more frequently if you are over 40 years old. Skin check. Lung cancer screening. You may have this screening every year starting at age 58 if you have a 30-pack-year history of smoking and currently smoke or have quit within the past 15 years. Fecal occult blood test (FOBT) of the stool. You may have this test every year starting at age 61. Flexible sigmoidoscopy or colonoscopy. You may have a sigmoidoscopy every 5 years or a colonoscopy every 10 years starting at age 24. Hepatitis C blood test. Hepatitis B blood test. Sexually transmitted disease (STD) testing. Diabetes screening. This is done by checking your blood sugar (glucose) after you have not eaten for a while (fasting). You may have this done every 1-3 years. Bone density scan. This is done to screen for osteoporosis. You may have this done starting at age 28. Mammogram. This may be done every 1-2 years. Talk  to your health care provider about how often you should have regular mammograms. Talk with your health care provider about your test results, treatment options, and if necessary, the need for more tests. Vaccines  Your health care provider may recommend certain vaccines, such as: Influenza vaccine. This is recommended every year. Tetanus, diphtheria, and acellular pertussis (Tdap, Td) vaccine. You may need a Td  booster every 10 years. Zoster vaccine. You may need this after age 74. Pneumococcal 13-valent conjugate (PCV13) vaccine. One dose is recommended after age 24. Pneumococcal polysaccharide (PPSV23) vaccine. One dose is recommended after age 52. Talk to your health care provider about which screenings and vaccines you need and how often you need them. This information is not intended to replace advice given to you by your health care provider. Make sure you discuss any questions you have with your health care provider. Document Released: 10/11/2015 Document Revised: 06/03/2016 Document Reviewed: 07/16/2015 Elsevier Interactive Patient Education  2017 [employer].  Fall Prevention in the Home Falls can cause injuries. They can happen to people of all ages. There are many things you can do to make your home safe and to help prevent falls. What can I do on the outside of my home? Regularly fix the edges of walkways and driveways and fix any cracks. Remove anything that might make you trip as you walk through a door, such as a raised step or threshold. Trim any bushes or trees on the path to your home. Use bright outdoor lighting. Clear any walking paths of anything that might make someone trip, such as rocks or tools. Regularly check to see if handrails are loose or broken. Make sure that both sides of any steps have handrails. Any raised decks and porches should have guardrails on the edges. Have any leaves, snow, or ice cleared regularly. Use sand or salt on walking paths during winter. Clean up any spills in your garage right away. This includes oil or grease spills. What can I do in the bathroom? Use night lights. Install grab bars by the toilet and in the tub and shower. Do not use towel bars as grab bars. Use non-skid mats or decals in the tub or shower. If you need to sit down in the shower, use a plastic, non-slip stool. Keep the floor dry. Clean up any water that spills on the floor  as soon as it happens. Remove soap buildup in the tub or shower regularly. Attach bath mats securely with double-sided non-slip rug tape. Do not have throw rugs and other things on the floor that can make you trip. What can I do in the bedroom? Use night lights. Make sure that you have a light by your bed that is easy to reach. Do not use any sheets or blankets that are too big for your bed. They should not hang down onto the floor. Have a firm chair that has side arms. You can use this for support while you get dressed. Do not have throw rugs and other things on the floor that can make you trip. What can I do in the kitchen? Clean up any spills right away. Avoid walking on wet floors. Keep items that you use a lot in easy-to-reach places. If you need to reach something above you, use a strong step stool that has a grab bar. Keep electrical cords out of the way. Do not use floor polish or wax that makes floors slippery. If you must use wax, use non-skid floor wax. Do  not have throw rugs and other things on the floor that can make you trip. What can I do with my stairs? Do not leave any items on the stairs. Make sure that there are handrails on both sides of the stairs and use them. Fix handrails that are broken or loose. Make sure that handrails are as long as the stairways. Check any carpeting to make sure that it is firmly attached to the stairs. Fix any carpet that is loose or worn. Avoid having throw rugs at the top or bottom of the stairs. If you do have throw rugs, attach them to the floor with carpet tape. Make sure that you have a light switch at the top of the stairs and the bottom of the stairs. If you do not have them, ask someone to add them for you. What else can I do to help prevent falls? Wear shoes that: Do not have high heels. Have rubber bottoms. Are comfortable and fit you well. Are closed at the toe. Do not wear sandals. If you use a stepladder: Make sure that it is  fully opened. Do not climb a closed stepladder. Make sure that both sides of the stepladder are locked into place. Ask someone to hold it for you, if possible. Clearly mark and make sure that you can see: Any grab bars or handrails. First and last steps. Where the edge of each step is. Use tools that help you move around (mobility aids) if they are needed. These include: Canes. Walkers. Scooters. Crutches. Turn on the lights when you go into a dark area. Replace any light bulbs as soon as they burn out. Set up your furniture so you have a clear path. Avoid moving your furniture around. If any of your floors are uneven, fix them. If there are any pets around you, be aware of where they are. Review your medicines with your doctor. Some medicines can make you feel dizzy. This can increase your chance of falling. Ask your doctor what other things that you can do to help prevent falls. This information is not intended to replace advice given to you by your health care provider. Make sure you discuss any questions you have with your health care provider. Document Released: 07/11/2009 Document Revised: 02/20/2016 Document Reviewed: 10/19/2014 Elsevier Interactive Patient Education  2017 [employer].

## 2023-02-26 NOTE — Progress Notes (Signed)
I connected with  Nicole Guzman on 02/26/23 by a audio enabled telemedicine application and verified that I am speaking with the correct person using two identifiers.  Patient Location: Home  Provider Location: Office/Clinic  I discussed the limitations of evaluation and management by telemedicine. The patient expressed understanding and agreed to proceed.  Subjective:   Nicole Guzman is a 82 y.o. female who presents for Medicare Annual (Subsequent) preventive examination.  Patient Medicare AWV questionnaire was completed by the patient on 02/26/2023; I have confirmed that all information answered by patient is correct and no changes since this date.    Review of Systems     Cardiac Risk Factors include: advanced age (>48men, >47 women);dyslipidemia     Objective:    Today's Vitals   02/26/23 1526  Weight: 156 lb (70.8 kg)  Height: 5\' 5"  (1.651 m)   Body mass index is 25.96 kg/m.     02/26/2023    3:31 PM 03/27/2022   10:42 AM 02/17/2022    8:26 AM 07/18/2021   12:04 PM 02/11/2021    9:49 AM 01/16/2021    2:00 PM  Advanced Directives  Does Patient Have a Medical Advance Directive? Yes No No No No No  Type of Estate agent of [place];Living will       Copy of Healthcare Power of Attorney in Chart? No - copy requested       Would patient like information on creating a medical advance directive?  No - Patient declined No - Patient declined No - Patient declined No - Patient declined No - Patient declined    Current Medications (verified) Outpatient Encounter Medications as of 02/26/2023  Medication Sig   Calcium Carb-Cholecalciferol (CALCIUM 500/D) 500-400 MG-UNIT CHEW Chew by mouth.   COMBIGAN 0.2-0.5 % ophthalmic solution Apply 1 drop to eye 2 (two) times daily.   denosumab (PROLIA) 60 MG/ML SOSY injection Inject 60 mg into the skin every 6 (six) months.   dorzolamide (TRUSOPT) 2 % ophthalmic solution Place 1 drop into both eyes 2 (two) times  daily.   metoprolol tartrate (LOPRESSOR) 25 MG tablet Take 1 tablet by mouth twice daily   Multiple Vitamins-Minerals (MULTIVITAMIN WITH MINERALS) tablet Take 1 tablet by mouth daily.   Travoprost, BAK Free, (TRAVATAN) 0.004 % SOLN ophthalmic solution INSTILL 1 DROP INTO EACH EYE EVERY DAY AT BEDTIME   vitamin C (ASCORBIC ACID) 500 MG tablet Take 500 mg by mouth daily.   Vitamin D, Cholecalciferol, 25 MCG (1000 UT) TABS Take by mouth.   XARELTO 20 MG TABS tablet Take 1 tablet by mouth once daily   moxifloxacin (VIGAMOX) 0.5 % ophthalmic solution Insert one drop into the surgical eye 3 times a day starting 4 days prior to surgery. (Patient not taking: Reported on 02/26/2023)   No facility-administered encounter medications on file as of 02/26/2023.    Allergies (verified) Patient has no known allergies.   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   Past Surgical History:  Procedure Laterality Date   ABDOMINAL HYSTERECTOMY     age 18   EYE SURGERY     Family History  Problem Relation Age of Onset   Hyperlipidemia Mother    Alzheimer's disease Mother    Stroke Father    Heart disease Father    Alzheimer's  disease Maternal Grandmother    Social History   Socioeconomic History   Marital status: Widowed    Spouse name: Not on file   Number of children: Not on file   Years of education: Not on file   Highest education level: Not on file  Occupational History   Occupation: retired  Tobacco Use   Smoking status: Former    Types: Cigarettes   Smokeless tobacco: Never   Tobacco comments:    quit 54 years ago  Vaping Use   Vaping Use: Never used  Substance and Sexual Activity   Alcohol use: Not Currently   Drug use: Never   Sexual activity: Yes  Other Topics Concern   Not on file  Social History Narrative   Not  on file   Social Determinants of Health   Financial Resource Strain: Low Risk  (02/26/2023)   Overall Financial Resource Strain (CARDIA)    Difficulty of Paying Living Expenses: Not hard at all  Food Insecurity: No Food Insecurity (02/26/2023)   Hunger Vital Sign    Worried About Running Out of Food in the Last Year: Never true    Ran Out of Food in the Last Year: Never true  Transportation Needs: No Transportation Needs (02/26/2023)   PRAPARE - Administrator, Civil Service (Medical): No    Lack of Transportation (Non-Medical): No  Physical Activity: Insufficiently Active (02/26/2023)   Exercise Vital Sign    Days of Exercise per Week: 3 days    Minutes of Exercise per Session: 30 min  Stress: No Stress Concern Present (02/26/2023)   [employer] of Occupational Health - Occupational Stress Questionnaire    Feeling of Stress : Not at all  Social Connections: Unknown (02/26/2023)   Social Connection and Isolation Panel [NHANES]    Frequency of Communication with Friends and Family: More than three times a week    Frequency of Social Gatherings with Friends and Family: Three times a week    Attends Religious Services: Not on file    Active Member of Clubs or Organizations: Yes    Attends Club or Organization Meetings: More than 4 times per year    Marital Status: Widowed    Tobacco Counseling Counseling given: Not Answered Tobacco comments: quit 54 years ago   Clinical Intake:  Pre-visit preparation completed: Yes  Pain : No/denies pain     Nutritional Status: BMI 25 -29 Overweight Nutritional Risks: None Diabetes: No  How often do you need to have someone help you when you read instructions, pamphlets, or other written materials from your doctor or pharmacy?: 1 - Never  Diabetic? no  Interpreter Needed?: No  Information entered by :: NAllen LPN   Activities of Daily Living    02/26/2023   11:39 AM  In your present state of health, do you have any  difficulty performing the following activities:  Hearing? 0  Vision? 0  Difficulty concentrating or making decisions? 0  Walking or climbing stairs? 0  Dressing or bathing? 0  Doing errands, shopping? 0  Preparing Food and eating ? N  Using the Toilet? N  In the past six months, have you accidently leaked urine? N  Do you have problems with loss of bowel control? N  Managing your Medications? N  Managing your Finances? N  Housekeeping or managing your Housekeeping? N    Patient Care Team: Loyola Mast, MD as PCP - General (Family Medicine) Myna Hidalgo Rose Phi, MD as Consulting Physician (Oncology)  Moya, Harrietta Guardian, MD as Referring Physician (Ophthalmology)  Indicate any recent Medical Services you may have received from other than [facility] providers in the past year (date may be approximate).     Assessment:   This is a routine wellness examination for [place].  Hearing/Vision screen Vision Screening - Comments:: Regular eye exams, [facility], Dr. Loraine Grip  Dietary issues and exercise activities discussed: Current Exercise Habits: Home exercise routine, Type of exercise: walking, Time (Minutes): 30, Frequency (Times/Week): 3, Weekly Exercise (Minutes/Week): 90   Goals Addressed             This Visit's Progress    Patient Stated       02/26/2023, wants to get cholesterol down       Depression Screen    02/26/2023    3:32 PM 02/17/2023    9:05 AM 02/17/2022    8:27 AM 02/17/2022    8:24 AM 08/04/2021    3:52 PM 02/11/2021    9:52 AM 12/11/2020    1:29 PM  PHQ 2/9 Scores  PHQ - 2 Score 0 0 0 0 0 0 0    Fall Risk    02/26/2023   11:39 AM 02/17/2023    9:05 AM 11/25/2022   11:01 AM 02/17/2022    8:26 AM 08/04/2021    3:52 PM  Fall Risk   Falls in the past year? 0 0 0 0 0  Number falls in past yr: 0 0 0 0 0  Injury with Fall? 0 0 0 0   Risk for fall due to : Medication side effect No Fall Risks No Fall Risks    Follow up Falls prevention discussed;Education  provided;Falls evaluation completed Falls prevention discussed Falls evaluation completed Falls evaluation completed     FALL RISK PREVENTION PERTAINING TO THE HOME:  Any stairs in or around the home? No  If so, are there any without handrails? N/a Home free of loose throw rugs in walkways, pet beds, electrical cords, etc? Yes  Adequate lighting in your home to reduce risk of falls? Yes   ASSISTIVE DEVICES UTILIZED TO PREVENT FALLS:  Life alert? No  Use of a cane, walker or w/c? No  Grab bars in the bathroom? Yes  Shower chair or bench in shower? No  Elevated toilet seat or a handicapped toilet? Yes   TIMED UP AND GO:  Was the test performed? No .      Cognitive Function:        02/26/2023    3:33 PM  6CIT Screen  What Year? 0 points  What month? 0 points  What time? 0 points  Count back from 20 0 points  Months in reverse 0 points  Repeat phrase 0 points  Total Score 0 points    Immunizations Immunization History  Administered Date(s) Administered   Fluad Quad(high Dose 65+) 07/08/2021, 06/22/2022   Influenza-Unspecified 08/14/2020   Moderna Sars-Covid-2 Vaccination 10/24/2019, 11/22/2019, 08/25/2020   Zoster Recombinat (Shingrix) 02/17/2021, 05/12/2021    TDAP status: Due, Education has been provided regarding the importance of this vaccine. Advised may receive this vaccine at local pharmacy or Health Dept. Aware to provide a copy of the vaccination record if obtained from local pharmacy or Health Dept. Verbalized acceptance and understanding.  Flu Vaccine status: Up to date  Pneumococcal vaccine status: Due, Education has been provided regarding the importance of this vaccine. Advised may receive this vaccine at local pharmacy or Health Dept. Aware to provide a copy of the  vaccination record if obtained from local pharmacy or Health Dept. Verbalized acceptance and understanding.  Covid-19 vaccine status: Completed vaccines  Qualifies for Shingles Vaccine?  Yes   Zostavax completed Yes   Shingrix Completed?: Yes  Screening Tests Health Maintenance  Topic Date Due   DTaP/Tdap/Td (1 - Tdap) Never done   Pneumonia Vaccine 40+ Years old (1 of 1 - PCV) Never done   Medicare Annual Wellness (AWV)  02/18/2023   INFLUENZA VACCINE  04/29/2023   DEXA SCAN  Completed   Hepatitis C Screening  Completed   Zoster Vaccines- Shingrix  Completed   HPV VACCINES  Aged Out   COVID-19 Vaccine  Discontinued    Health Maintenance  Health Maintenance Due  Topic Date Due   DTaP/Tdap/Td (1 - Tdap) Never done   Pneumonia Vaccine 1+ Years old (1 of 1 - PCV) Never done   Medicare Annual Wellness (AWV)  02/18/2023    Colorectal cancer screening: Type of screening: Cologuard. Completed 09/07/2022. Repeat every 3 years  Mammogram status: Completed 10/01/2021. Repeat every year  Bone Density status: Completed 09/24/2022.   Lung Cancer Screening: (Low Dose CT Chest recommended if Age 13-80 years, 30 pack-year currently smoking OR have quit w/in 15years.) does not qualify.   Lung Cancer Screening Referral: no  Additional Screening:  Hepatitis C Screening: does qualify; Completed 02/17/2023  Vision Screening: Recommended annual ophthalmology exams for early detection of glaucoma and other disorders of the eye. Is the patient up to date with their annual eye exam?  Yes  Who is the provider or what is the name of the office in which the patient attends annual eye exams? [facility] If pt is not established with a provider, would they like to be referred to a provider to establish care? No .   Dental Screening: Recommended annual dental exams for proper oral hygiene  Community Resource Referral / Chronic Care Management: CRR required this visit?  No   CCM required this visit?  No      Plan:     I have personally reviewed and noted the following in the patient's chart:   Medical and social history Use of alcohol, tobacco or illicit drugs   Current medications and supplements including opioid prescriptions. Patient is not currently taking opioid prescriptions. Functional ability and status Nutritional status Physical activity Advanced directives List of other physicians Hospitalizations, surgeries, and ER visits in previous 12 months Vitals Screenings to include cognitive, depression, and falls Referrals and appointments  In addition, I have reviewed and discussed with patient certain preventive protocols, quality metrics, and best practice recommendations. A written personalized care plan for preventive services as well as general preventive health recommendations were provided to patient.     Barb Merino, LPN   1/61/0960   Nurse Notes: none  Due to this being a virtual visit, the after visit summary with patients personalized plan was offered to patient via mail or my-chart. Patient would like to access on my-chart

## 2023-03-11 ENCOUNTER — Other Ambulatory Visit: Payer: Self-pay | Admitting: Family Medicine

## 2023-03-11 ENCOUNTER — Telehealth: Payer: Self-pay | Admitting: Family Medicine

## 2023-03-11 ENCOUNTER — Other Ambulatory Visit (HOSPITAL_COMMUNITY): Payer: Self-pay

## 2023-03-11 ENCOUNTER — Other Ambulatory Visit: Payer: Self-pay

## 2023-03-11 DIAGNOSIS — M818 Other osteoporosis without current pathological fracture: Secondary | ICD-10-CM

## 2023-03-11 MED ORDER — PROLIA 60 MG/ML ~~LOC~~ SOSY
60.0000 mg | PREFILLED_SYRINGE | SUBCUTANEOUS | 1 refills | Status: DC
  Filled 2023-03-11: qty 1, 180d supply, fill #0

## 2023-03-11 NOTE — Telephone Encounter (Signed)
Pt would like a call to discuss her prolia shot. She got a call from someone about that.

## 2023-03-11 NOTE — Telephone Encounter (Signed)
Spoke to patient, she states that she had spoke to the [employer] that sends out the medication and they wanted to know when her next appointment was so they could send it out.  She thinks she is due for her next Prolia in one month.  Can you check on this for her? Thanks. Dm/cma

## 2023-03-11 NOTE — Telephone Encounter (Signed)
Judeth Cornfield from [facility] called [phone] and said the reason for holding on xarelto  before her surgury. Please call stephanie

## 2023-03-11 NOTE — Telephone Encounter (Signed)
Lft VM to rtn call. Dm/cma  

## 2023-03-12 ENCOUNTER — Encounter: Payer: Self-pay | Admitting: Family Medicine

## 2023-03-12 NOTE — Telephone Encounter (Signed)
Form and letter sent back to [place], then called and she had already received it and spoke to the patient. Dm/cma

## 2023-03-12 NOTE — Telephone Encounter (Signed)
Form given back to provider to advise. Dm/cma

## 2023-03-12 NOTE — Telephone Encounter (Signed)
Need form to be updated regarding holding xarelto with instructions.

## 2023-03-17 ENCOUNTER — Other Ambulatory Visit (HOSPITAL_COMMUNITY): Payer: Self-pay

## 2023-03-25 ENCOUNTER — Other Ambulatory Visit (HOSPITAL_COMMUNITY): Payer: Self-pay

## 2023-03-26 ENCOUNTER — Other Ambulatory Visit (HOSPITAL_COMMUNITY): Payer: Self-pay

## 2023-03-26 MED ORDER — PROLIA 60 MG/ML ~~LOC~~ SOSY
60.0000 mg | PREFILLED_SYRINGE | SUBCUTANEOUS | 1 refills | Status: AC
  Filled 2023-03-26: qty 1, 180d supply, fill #0
  Filled 2023-10-01: qty 1, 180d supply, fill #1

## 2023-03-26 NOTE — Addendum Note (Signed)
Addended by: Lake Bells on: 03/26/2023 04:54 PM   Modules accepted: Orders

## 2023-03-26 NOTE — Telephone Encounter (Signed)
Patient scheduled to come in for Prolia injection Rx sent to pharmacy to then be mailed to office for patient to receive in office with $25 copay at time of visit. Patient aware of all above.

## 2023-03-29 ENCOUNTER — Other Ambulatory Visit: Payer: Self-pay

## 2023-03-29 DIAGNOSIS — C50912 Malignant neoplasm of unspecified site of left female breast: Secondary | ICD-10-CM

## 2023-03-30 ENCOUNTER — Encounter: Payer: Self-pay | Admitting: Hematology & Oncology

## 2023-03-30 ENCOUNTER — Other Ambulatory Visit: Payer: Self-pay

## 2023-03-30 ENCOUNTER — Inpatient Hospital Stay: Payer: Medicare PPO

## 2023-03-30 ENCOUNTER — Inpatient Hospital Stay: Payer: Medicare PPO | Attending: Hematology & Oncology | Admitting: Hematology & Oncology

## 2023-03-30 VITALS — BP 153/94 | HR 72 | Temp 98.9°F | Resp 16 | Ht 65.0 in | Wt 157.0 lb

## 2023-03-30 DIAGNOSIS — Z853 Personal history of malignant neoplasm of breast: Secondary | ICD-10-CM | POA: Insufficient documentation

## 2023-03-30 DIAGNOSIS — C50912 Malignant neoplasm of unspecified site of left female breast: Secondary | ICD-10-CM

## 2023-03-30 DIAGNOSIS — Z7901 Long term (current) use of anticoagulants: Secondary | ICD-10-CM | POA: Insufficient documentation

## 2023-03-30 DIAGNOSIS — I4891 Unspecified atrial fibrillation: Secondary | ICD-10-CM | POA: Insufficient documentation

## 2023-03-30 DIAGNOSIS — Z634 Disappearance and death of family member: Secondary | ICD-10-CM | POA: Diagnosis not present

## 2023-03-30 LAB — CBC WITH DIFFERENTIAL (CANCER CENTER ONLY)
Abs Immature Granulocytes: 0.02 10*3/uL (ref 0.00–0.07)
Basophils Absolute: 0 10*3/uL (ref 0.0–0.1)
Basophils Relative: 0 %
Eosinophils Absolute: 0.1 10*3/uL (ref 0.0–0.5)
Eosinophils Relative: 1 %
HCT: 43.2 % (ref 36.0–46.0)
Hemoglobin: 13.8 g/dL (ref 12.0–15.0)
Immature Granulocytes: 0 %
Lymphocytes Relative: 35 %
Lymphs Abs: 2.4 10*3/uL (ref 0.7–4.0)
MCH: 31.9 pg (ref 26.0–34.0)
MCHC: 31.9 g/dL (ref 30.0–36.0)
MCV: 99.8 fL (ref 80.0–100.0)
Monocytes Absolute: 0.6 10*3/uL (ref 0.1–1.0)
Monocytes Relative: 8 %
Neutro Abs: 3.9 10*3/uL (ref 1.7–7.7)
Neutrophils Relative %: 56 %
Platelet Count: 248 10*3/uL (ref 150–400)
RBC: 4.33 MIL/uL (ref 3.87–5.11)
RDW: 14.1 % (ref 11.5–15.5)
WBC Count: 7 10*3/uL (ref 4.0–10.5)
nRBC: 0 % (ref 0.0–0.2)

## 2023-03-30 LAB — CMP (CANCER CENTER ONLY)
ALT: 14 U/L (ref 0–44)
AST: 17 U/L (ref 15–41)
Albumin: 4.2 g/dL (ref 3.5–5.0)
Alkaline Phosphatase: 53 U/L (ref 38–126)
Anion gap: 4 — ABNORMAL LOW (ref 5–15)
BUN: 13 mg/dL (ref 8–23)
CO2: 33 mmol/L — ABNORMAL HIGH (ref 22–32)
Calcium: 10.2 mg/dL (ref 8.9–10.3)
Chloride: 102 mmol/L (ref 98–111)
Creatinine: 0.71 mg/dL (ref 0.44–1.00)
GFR, Estimated: >60 mL/min (ref >60)
Glucose, Bld: 90 mg/dL (ref 70–99)
Potassium: 4.7 mmol/L (ref 3.5–5.1)
Sodium: 139 mmol/L (ref 135–145)
Total Bilirubin: 0.7 mg/dL (ref 0.3–1.2)
Total Protein: 7.4 g/dL (ref 6.5–8.1)

## 2023-03-30 LAB — LACTATE DEHYDROGENASE: LDH: 160 U/L (ref 98–192)

## 2023-03-30 NOTE — Progress Notes (Signed)
Hematology and Oncology Follow Up Visit  Nicole Guzman [id number] July 31, 1941 82 y.o. 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 daily     Interim History:  Nicole Guzman is back for follow-up.  The bad news is that her husband passed away in December 13, 2022.  It sounds like he developed some sepsis and she could never recover.  I really feel bad for her about this.  Otherwise, she seems to be managing.  She does have a lot of help and support.  She continues on Xarelto for the atrial fibrillation.  The atrial fibrillation does seem to be under good control.  She has had no problems with nausea or vomiting.  She has had no change in bowel or bladder habits.  She has had no cough or shortness of breath.  She has had no leg swelling.  She has had a little bit of venous stasis in the lower legs.  Overall, I would say that her performance status is probably ECOG 2.   .  Medications:  Current Outpatient Medications:    Calcium Carb-Cholecalciferol (CALCIUM 500/D) 500-400 MG-UNIT CHEW, Chew by mouth., Disp: , Rfl:    COMBIGAN 0.2-0.5 % ophthalmic solution, Apply 1 drop to eye 2 (two) times daily., Disp: , Rfl:    denosumab (PROLIA) 60 MG/ML SOSY injection, Inject 60 mg into the skin every 6 (six) months., Disp: 1 mL, Rfl: 1   dorzolamide (TRUSOPT) 2 % ophthalmic solution, Place 1 drop into both eyes 2 (two) times daily., Disp: , Rfl:    metoprolol tartrate (LOPRESSOR) 25 MG tablet, Take 1 tablet by mouth twice daily, Disp: 180 tablet, Rfl: 3   moxifloxacin (VIGAMOX) 0.5 % ophthalmic solution, Insert one drop into the surgical eye 3 times a day starting 4 days prior to surgery. (Patient not taking: Reported on 02/26/2023), Disp: , Rfl:    Multiple  Vitamins-Minerals (MULTIVITAMIN WITH MINERALS) tablet, Take 1 tablet by mouth daily., Disp: , Rfl:    Travoprost, BAK Free, (TRAVATAN) 0.004 % SOLN ophthalmic solution, INSTILL 1 DROP INTO EACH EYE EVERY DAY AT BEDTIME, Disp: , Rfl:    vitamin C (ASCORBIC ACID) 500 MG tablet, Take 500 mg by mouth daily., Disp: , Rfl:    Vitamin D, Cholecalciferol, 25 MCG (1000 UT) TABS, Take by mouth., Disp: , Rfl:    XARELTO 20 MG TABS tablet, Take 1 tablet by mouth once daily, Disp: 30 tablet, Rfl: 11  Allergies: No Known Allergies  Past Medical History, Surgical history, Social history, and Family History were reviewed and updated.  Review of Systems: Review of Systems  Constitutional: Negative.   HENT:  Negative.    Eyes: Negative.   Respiratory: Negative.    Cardiovascular: Negative.   Gastrointestinal: Negative.   Endocrine: Negative.   Genitourinary: Negative.    Musculoskeletal: Negative.   Skin: Negative.   Neurological: Negative.   Hematological: Negative.   Psychiatric/Behavioral: Negative.      Physical Exam:  height is 5\' 5"  (1.651 m) and weight is 157 lb (71.2 kg). Her oral temperature is 98.9 F (37.2 C). Her blood pressure is 153/94 (abnormal) and her pulse is 72. Her respiration is 16 and oxygen saturation is 96%.   Wt Readings from Last 3 Encounters:  03/30/23 157 lb (  71.2 kg)  02/26/23 156 lb (70.8 kg)  02/17/23 156 lb 9.6 oz (71 kg)    Physical Exam Vitals reviewed.  Constitutional:      Comments: Right breast shows no edema, erythema or swelling.  There is no nipple discharge.  There is no right axillary adenopathy.  Left breast shows changes from lumpectomy and radiation.  She has the lumpectomy scar at about the 8 o'clock position.  This is well-healed.  There is no left axillary adenopathy.  HENT:     Head: Normocephalic and atraumatic.  Eyes:     Pupils: Pupils are equal, round, and reactive to light.  Cardiovascular:     Rate and Rhythm: Normal rate and regular  rhythm.     Heart sounds: Normal heart sounds.  Pulmonary:     Effort: Pulmonary effort is normal.     Breath sounds: Normal breath sounds.  Abdominal:     General: Bowel sounds are normal.     Palpations: Abdomen is soft.  Musculoskeletal:        General: No tenderness or deformity. Normal range of motion.     Cervical back: Normal range of motion.  Lymphadenopathy:     Cervical: No cervical adenopathy.  Skin:    General: Skin is warm and dry.     Findings: No erythema or rash.  Neurological:     Mental Status: She is alert and oriented to person, place, and time.  Psychiatric:        Behavior: Behavior normal.        Thought Content: Thought content normal.        Judgment: Judgment normal.      Lab Results  Component Value Date   WBC 7.0 03/30/2023   HGB 13.8 03/30/2023   HCT 43.2 03/30/2023   MCV 99.8 03/30/2023   PLT 248 03/30/2023     Chemistry      Component Value Date/Time   NA 139 03/30/2023 1139   K 4.7 03/30/2023 1139   CL 102 03/30/2023 1139   CO2 33 (H) 03/30/2023 1139   BUN 13 03/30/2023 1139   CREATININE 0.71 03/30/2023 1139      Component Value Date/Time   CALCIUM 10.2 03/30/2023 1139   ALKPHOS 53 03/30/2023 1139   AST 17 03/30/2023 1139   ALT 14 03/30/2023 1139   BILITOT 0.7 03/30/2023 1139      Impression and Plan: Nicole Guzman is a very nice 82 year old postmenopausal white female.  She has had history of stage IIa infiltrating lobular carcinoma of the left breast.  She had treatment for this.  This was about 11 years ago with radiation and surgery.  She was on Arimidex.  We stopped the Arimidex in October.  I do feel bad that her husband passed away.  I know that they are married 60 years.  I know this is been difficult for her.  We will still follow her up.  We will get her back in 1 more year.   Josph Macho, MD 7/2/20241:18 PM

## 2023-04-05 DIAGNOSIS — B078 Other viral warts: Secondary | ICD-10-CM | POA: Diagnosis not present

## 2023-04-05 DIAGNOSIS — Z129 Encounter for screening for malignant neoplasm, site unspecified: Secondary | ICD-10-CM | POA: Diagnosis not present

## 2023-04-05 DIAGNOSIS — L821 Other seborrheic keratosis: Secondary | ICD-10-CM | POA: Diagnosis not present

## 2023-04-05 DIAGNOSIS — D1801 Hemangioma of skin and subcutaneous tissue: Secondary | ICD-10-CM | POA: Diagnosis not present

## 2023-04-06 ENCOUNTER — Ambulatory Visit: Payer: Medicare PPO

## 2023-04-06 DIAGNOSIS — M818 Other osteoporosis without current pathological fracture: Secondary | ICD-10-CM | POA: Diagnosis not present

## 2023-04-06 MED ORDER — DENOSUMAB 60 MG/ML ~~LOC~~ SOSY
60.0000 mg | PREFILLED_SYRINGE | Freq: Once | SUBCUTANEOUS | Status: AC
  Administered 2023-04-06: 60 mg via SUBCUTANEOUS

## 2023-04-06 NOTE — Progress Notes (Addendum)
After obtaining consent, and per orders of Dr. Veto Kemps, injection of Prolia 60mg  given subQ in the right arm by Lorn Junes Smith,CMA. Patient instructed to remain in clinic for 20 minutes afterwards, and to report any adverse reaction to me immediately.

## 2023-04-26 ENCOUNTER — Other Ambulatory Visit (HOSPITAL_BASED_OUTPATIENT_CLINIC_OR_DEPARTMENT_OTHER): Payer: Self-pay | Admitting: Family Medicine

## 2023-04-26 DIAGNOSIS — H401113 Primary open-angle glaucoma, right eye, severe stage: Secondary | ICD-10-CM | POA: Insufficient documentation

## 2023-04-26 DIAGNOSIS — Z1231 Encounter for screening mammogram for malignant neoplasm of breast: Secondary | ICD-10-CM

## 2023-04-27 DIAGNOSIS — E785 Hyperlipidemia, unspecified: Secondary | ICD-10-CM | POA: Diagnosis not present

## 2023-04-27 DIAGNOSIS — Z79899 Other long term (current) drug therapy: Secondary | ICD-10-CM | POA: Diagnosis not present

## 2023-04-27 DIAGNOSIS — H401113 Primary open-angle glaucoma, right eye, severe stage: Secondary | ICD-10-CM | POA: Diagnosis not present

## 2023-04-27 DIAGNOSIS — Z7901 Long term (current) use of anticoagulants: Secondary | ICD-10-CM | POA: Diagnosis not present

## 2023-04-27 DIAGNOSIS — I4891 Unspecified atrial fibrillation: Secondary | ICD-10-CM | POA: Diagnosis not present

## 2023-04-27 DIAGNOSIS — I10 Essential (primary) hypertension: Secondary | ICD-10-CM | POA: Diagnosis not present

## 2023-05-18 ENCOUNTER — Ambulatory Visit (HOSPITAL_BASED_OUTPATIENT_CLINIC_OR_DEPARTMENT_OTHER)
Admission: RE | Admit: 2023-05-18 | Discharge: 2023-05-18 | Disposition: A | Discharge status: Home / Self Care | Payer: Medicare PPO | Source: Ambulatory Visit | Attending: Family Medicine | Admitting: Family Medicine

## 2023-05-18 DIAGNOSIS — Z1231 Encounter for screening mammogram for malignant neoplasm of breast: Secondary | ICD-10-CM | POA: Diagnosis not present

## 2023-05-20 ENCOUNTER — Other Ambulatory Visit: Payer: Self-pay | Admitting: Family Medicine

## 2023-05-20 DIAGNOSIS — R928 Other abnormal and inconclusive findings on diagnostic imaging of breast: Secondary | ICD-10-CM

## 2023-06-01 ENCOUNTER — Ambulatory Visit
Admission: RE | Admit: 2023-06-01 | Discharge: 2023-06-01 | Disposition: A | Discharge status: Home / Self Care | Payer: Medicare PPO | Source: Ambulatory Visit | Attending: Family Medicine | Admitting: Family Medicine

## 2023-06-01 ENCOUNTER — Encounter: Payer: Self-pay | Admitting: Family Medicine

## 2023-06-01 ENCOUNTER — Other Ambulatory Visit: Payer: Self-pay | Admitting: Family Medicine

## 2023-06-01 DIAGNOSIS — R928 Other abnormal and inconclusive findings on diagnostic imaging of breast: Secondary | ICD-10-CM

## 2023-06-01 DIAGNOSIS — Z853 Personal history of malignant neoplasm of breast: Secondary | ICD-10-CM | POA: Diagnosis not present

## 2023-06-01 DIAGNOSIS — N6310 Unspecified lump in the right breast, unspecified quadrant: Secondary | ICD-10-CM

## 2023-06-01 DIAGNOSIS — N6311 Unspecified lump in the right breast, upper outer quadrant: Secondary | ICD-10-CM | POA: Diagnosis not present

## 2023-06-01 DIAGNOSIS — C50911 Malignant neoplasm of unspecified site of right female breast: Secondary | ICD-10-CM | POA: Insufficient documentation

## 2023-06-03 ENCOUNTER — Ambulatory Visit
Admission: RE | Admit: 2023-06-03 | Discharge: 2023-06-03 | Disposition: A | Discharge status: Home / Self Care | Payer: Medicare PPO | Source: Ambulatory Visit | Attending: Family Medicine | Admitting: Family Medicine

## 2023-06-03 DIAGNOSIS — N6310 Unspecified lump in the right breast, unspecified quadrant: Secondary | ICD-10-CM

## 2023-06-03 DIAGNOSIS — R928 Other abnormal and inconclusive findings on diagnostic imaging of breast: Secondary | ICD-10-CM

## 2023-06-03 DIAGNOSIS — C50411 Malignant neoplasm of upper-outer quadrant of right female breast: Secondary | ICD-10-CM | POA: Diagnosis not present

## 2023-06-03 DIAGNOSIS — N6311 Unspecified lump in the right breast, upper outer quadrant: Secondary | ICD-10-CM | POA: Diagnosis not present

## 2023-06-03 HISTORY — PX: BREAST BIOPSY: SHX20

## 2023-06-04 ENCOUNTER — Other Ambulatory Visit: Payer: Self-pay | Admitting: Family Medicine

## 2023-06-04 ENCOUNTER — Encounter: Payer: Self-pay | Admitting: Family Medicine

## 2023-06-04 DIAGNOSIS — C50911 Malignant neoplasm of unspecified site of right female breast: Secondary | ICD-10-CM

## 2023-06-04 NOTE — Progress Notes (Signed)
Bonnetta Barry, RN  Tollie Eth, RN; Josph Macho, MD; Loyola Mast, MD Hello,  Here is the biopsy results from 06/03/2023 at [facility]  Breast, RIGHT, needle core biopsy, ultrasound guided core, coil clip, 10 o'clock, 8 cm fn INVASIVE MODERATELY DIFFERENTIATED DUCTAL ADENOCARCINOMA, GRADE 2 (3+2+1) TUBULE FORMATION: SCORE 3 - NUCLEAR PLEOMORPHISM: SCORE 2 - MITOTIC COUNT: SCORE 1 TOTAL SCORE: 6 - OVERALL GRADE GRADE 2 (6/9) - NEGATIVE FOR ANGIOLYMPHATIC INVASION NEGATIVE FOR MICROCALCIFICATIONS - TUMOR MEASURES 7 MM IN GREATEST LINEAR EXTENT  This patient would like a surgical referral to a [place] [facility]geon- Dr Veto Kemps, could you please do this for her?  Thank you,  Lynett Grimes, RN Nurse Navigator [facility] [phone]  1. Adenocarcinoma of right breast [facility])  - Ambulatory referral to General Surgery

## 2023-06-14 ENCOUNTER — Telehealth: Payer: Self-pay | Admitting: Family Medicine

## 2023-06-14 DIAGNOSIS — H401113 Primary open-angle glaucoma, right eye, severe stage: Secondary | ICD-10-CM | POA: Diagnosis not present

## 2023-06-14 NOTE — Telephone Encounter (Signed)
French Ana called and would like for you to call her back [phone]

## 2023-06-15 NOTE — Telephone Encounter (Signed)
All ready taken care of. Dm/cma

## 2023-06-17 ENCOUNTER — Encounter: Payer: Self-pay | Admitting: *Deleted

## 2023-06-18 NOTE — Progress Notes (Signed)
Patient is an established patient of Dr Myna Hidalgo for L breast cancer 2012. Received a message from GI that patient has a new breast cancer diagnosis R [facility] ER/PR+ HER2-. They are requesting that a referral be placed to Dr Jennet Maduro for surgical consult. PCP responded that it was done on 06/04/2023.  Review of chart does not show an appointment with the surgeon. I called that office and they have no record of having received the referral. Referral re-sent.   Oncology Nurse Navigator Documentation     06/17/2023   11:00 AM  Oncology Nurse Navigator Flowsheets  Abnormal Finding Date 05/19/2023  Confirmed Diagnosis Date 06/03/2023  Navigator Follow Up Date: 06/28/2023  Navigator Follow Up Reason: Review Note  Navigator Location [facility]  Navigator Encounter Type Appt/Treatment Plan Review  Patient Visit Type MedOnc  Treatment Phase Pre-Tx/Tx Discussion  Barriers/Navigation Needs Coordination of Care  Interventions Referrals  Acuity Level 2-Minimal Needs (1-2 Barriers Identified)  Referrals Other  Time Spent with Patient 30

## 2023-06-19 ENCOUNTER — Encounter (HOSPITAL_COMMUNITY): Payer: Self-pay

## 2023-06-21 DIAGNOSIS — H25811 Combined forms of age-related cataract, right eye: Secondary | ICD-10-CM | POA: Insufficient documentation

## 2023-06-21 DIAGNOSIS — H25812 Combined forms of age-related cataract, left eye: Secondary | ICD-10-CM | POA: Insufficient documentation

## 2023-06-28 DIAGNOSIS — Z170 Estrogen receptor positive status [ER+]: Secondary | ICD-10-CM | POA: Diagnosis not present

## 2023-06-28 DIAGNOSIS — Z7901 Long term (current) use of anticoagulants: Secondary | ICD-10-CM | POA: Insufficient documentation

## 2023-06-28 DIAGNOSIS — C50411 Malignant neoplasm of upper-outer quadrant of right female breast: Secondary | ICD-10-CM | POA: Diagnosis not present

## 2023-06-28 DIAGNOSIS — Z853 Personal history of malignant neoplasm of breast: Secondary | ICD-10-CM | POA: Diagnosis not present

## 2023-06-28 DIAGNOSIS — I4811 Longstanding persistent atrial fibrillation: Secondary | ICD-10-CM | POA: Diagnosis not present

## 2023-07-02 ENCOUNTER — Ambulatory Visit: Payer: Medicare PPO | Admitting: Family Medicine

## 2023-07-02 ENCOUNTER — Encounter: Payer: Self-pay | Admitting: Family Medicine

## 2023-07-02 ENCOUNTER — Telehealth: Payer: Self-pay

## 2023-07-02 VITALS — BP 122/66 | HR 65 | Temp 98.2°F | Ht 65.0 in | Wt 163.8 lb

## 2023-07-02 DIAGNOSIS — I4811 Longstanding persistent atrial fibrillation: Secondary | ICD-10-CM | POA: Diagnosis not present

## 2023-07-02 DIAGNOSIS — Z01818 Encounter for other preprocedural examination: Secondary | ICD-10-CM | POA: Insufficient documentation

## 2023-07-02 DIAGNOSIS — Z170 Estrogen receptor positive status [ER+]: Secondary | ICD-10-CM

## 2023-07-02 DIAGNOSIS — C50411 Malignant neoplasm of upper-outer quadrant of right female breast: Secondary | ICD-10-CM | POA: Diagnosis not present

## 2023-07-02 DIAGNOSIS — Z7901 Long term (current) use of anticoagulants: Secondary | ICD-10-CM | POA: Diagnosis not present

## 2023-07-02 NOTE — Assessment & Plan Note (Signed)
Hold for 48 hours prior to surgery.

## 2023-07-02 NOTE — Patient Outreach (Signed)
Care Coordination   In Person Provider Office Visit Note   07/02/2023 Name: Nicole Guzman MRN: [id number] DOB: 1940/11/06  Nicole Guzman is a 82 y.o. year old female who sees Rudd, Bertram Millard, MD for primary care. I engaged with Nicole Guzman in the providers office today.  What matters to the patients health and wellness today?  none    Goals Addressed             This Visit's Progress    COMPLETED: Care Coordination Activities-No follow up required       Care Coordination Interventions: Advised patient to Annual Wellness exam. Discussed [facility] services and support. Assessed SDOH. Advised to discuss with primary care physician if services needed in the future.          SDOH assessments and interventions completed:  Yes     Care Coordination Interventions:  Yes, provided   Follow up plan: No further intervention required.   Encounter Outcome:  Patient Visit Completed   Bary Leriche, RN, MSN [facility]  [facility], [facility] Management Community Coordinator Direct Dial: [phone]  Fax: [phone] Website: [email or web address]

## 2023-07-02 NOTE — Assessment & Plan Note (Signed)
Plans to proceed with lumpectomy by Dr. Lenox Ahr.

## 2023-07-02 NOTE — Progress Notes (Signed)
[facility] [address] [place] [address] Dept: [phone] Dept Fax: [phone]  Office Visit  Subjective:    Patient ID: Nicole Guzman, female    DOB: 04-02-1941, 82 y.o..   MRN: [id number]  Chief Complaint  Patient presents with   Pre-op Exam   History of Present Illness:  Patient is in today for preoperative evaluation. Ms. Males has a history of prior left breast cancer. She had her annual mammogram on 05/18/2023 which did demonstrate a new right breast mass. She underwent further diagnostic studies and biopsies that shows she has an HER2-  adenocarcinoma of the right breast. Dr. Lenox Ahr proposes a lumpectomy and does not feel chemotherapy or radiation will be needed.   Ms. Caul has a history of atrial fibrillation. She is managed on metoprolol succinate 25 mg daily for rate control. She is on Xarelto 20 mg daily for stroke risk reduction. She has not experienced any palpitations and feels this is doing well overall.   Ms. 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 She is currently on Prolia and is no longer on her AI. She is taking her calcium/Vit. D and a separate Vitamin D supplement.   Ms. Stoutamire has a history of a borderline hyperlipidemia. She is not on medication for this.  Past Medical History: Patient Active Problem List   Diagnosis Date Noted   Chronic anticoagulation 06/28/2023   Malignant neoplasm of upper-outer quadrant of right breast in female, estrogen receptor positive (HCC) 06/28/2023   Combined forms of age-related cataract of left eye 06/21/2023   Combined forms of age-related cataract of right eye 06/21/2023   Primary open angle glaucoma (POAG) of right eye, severe stage 04/26/2023   Other chest pain 11/25/2022   Stage II breast cancer, left (HCC) 01/16/2021   Borderline hyperlipidemia 12/26/2020   Vitamin D deficiency 12/26/2020    Allergic rhinitis 12/26/2020   Diverticulosis 12/26/2020   History of breast cancer 12/11/2020   Glaucoma 12/11/2020   Atrial fibrillation (HCC) 12/11/2020   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     COMBIGAN 0.2-0.5 % ophthalmic solution Apply 1 drop to eye 2 (two) times daily.     denosumab (PROLIA) 60 MG/ML SOSY injection Inject 60 mg into the skin every 6 (six) months. 1 mL 1   dorzolamide (TRUSOPT) 2 % ophthalmic solution Place 1 drop into both eyes 2 (two) times daily.     metoprolol tartrate (LOPRESSOR) 25 MG tablet Take 1 tablet by mouth twice daily 180 tablet 3   Multiple Vitamins-Minerals (MULTIVITAMIN WITH MINERALS) tablet Take 1 tablet by mouth daily.     Travoprost, BAK Free, (TRAVATAN) 0.004 % SOLN ophthalmic solution INSTILL 1 DROP INTO EACH EYE EVERY DAY AT BEDTIME     vitamin C (ASCORBIC ACID)  500 MG tablet Take 500 mg by mouth daily.     Vitamin D, Cholecalciferol, 25 MCG (1000 UT) TABS Take by mouth.     XARELTO 20 MG TABS tablet Take 1 tablet by mouth once daily 30 tablet 11   moxifloxacin (VIGAMOX) 0.5 % ophthalmic solution Insert one drop into the surgical eye 3 times a day starting 4 days prior to surgery. (Patient not taking: Reported on 02/26/2023)     No facility-administered medications prior to visit.   No Known Allergies   Objective:   Today's Vitals   07/02/23 1030  BP: 122/66   Pulse: 65  Temp: 98.2 F (36.8 C)  TempSrc: Temporal  SpO2: 96%  Weight: 163 lb 12.8 oz (74.3 kg)  Height: 5\' 5"  (1.651 m)   Body mass index is 27.26 kg/m.   General: Well developed, well nourished. No acute distress. HEENT: Normocephalic, non-traumatic. PERRL, EOMI. Conjunctiva clear. External ears normal. Left EAC   with impacted wax. Right EAC and TM normal. Nose clear without congestion or rhinorrhea. Mucous   membranes moist. Oropharynx clear. Fair dentition. Neck: Supple. No lymphadenopathy. No thyromegaly. Lungs: Clear to auscultation bilaterally. No wheezing, rales or rhonchi. CV: RRR without murmurs or rubs. Pulses 2+ bilaterally. Abdomen: Soft, non-tender. Bowel sounds positive, normal pitch and frequency. No hepatosplenomegaly. No   rebound or guarding. Back: Straight. No CVA tenderness bilaterally. Extremities: Full ROM. No joint swelling or tenderness. No edema noted. Skin: Warm and dry. Multiple seborrheic keratoses. Psych: Alert and oriented. Normal mood and affect.  Health Maintenance Due  Topic Date Due   DTaP/Tdap/Td (1 - Tdap) Never done   Pneumonia Vaccine 61+ Years old (1 of 1 - PCV) Never done   INFLUENZA VACCINE  04/29/2023   Lab Results    Latest Ref Rng & Units 03/30/2023   11:39 AM 03/27/2022   10:00 AM 07/18/2021   11:33 AM  CBC  WBC 4.0 - 10.5 K/uL 7.0  9.0  6.6   Hemoglobin 12.0 - 15.0 g/dL 16.1  09.6  04.5   Hematocrit 36.0 - 46.0 % 43.2  41.9  40.5   Platelets 150 - 400 K/uL 248  257  266        Latest Ref Rng & Units 03/30/2023   11:39 AM 03/27/2022   10:00 AM 07/18/2021   11:33 AM  CMP  Glucose 70 - 99 mg/dL 90  94  92   BUN 8 - 23 mg/dL 13  15  16    Creatinine 0.44 - 1.00 mg/dL 4.09  8.11  9.14   Sodium 135 - 145 mmol/L 139  139  139   Potassium 3.5 - 5.1 mmol/L 4.7  4.5  4.4   Chloride 98 - 111 mmol/L 102  102  101   CO2 22 - 32 mmol/L 33  33  32   Calcium 8.9 - 10.3 mg/dL 78.2  95.6  21.3   Total Protein 6.5 - 8.1 g/dL 7.4  7.1   6.9   Total Bilirubin 0.3 - 1.2 mg/dL 0.7  0.6  0.7   Alkaline Phos 38 - 126 U/L 53  65  56   AST 15 - 41 U/L 17  16  18    ALT 0 - 44 U/L 14  12  16       Assessment & Plan:   Problem List Items Addressed This Visit       Cardiovascular and Mediastinum   Atrial fibrillation (HCC)    On exam appears to  be in normal sinus rhythm at present. Low risk for complications with disrupting anticoagulation around the time of surgery.        Other   Chronic anticoagulation    Hold for 48 hours prior to surgery.      Malignant neoplasm of upper-outer quadrant of right breast in female, estrogen receptor positive (HCC)    Plans to proceed with lumpectomy by Dr. Lenox Ahr.      Preoperative examination - Primary    Low risk for complicatiosn of surgery. I will clear her to proceed. She shoudl hold her rivaroxaban (Xarelto) for 48 hours prior to surgery and can resume this the next day.        Return in about 6 months (around 12/31/2023) for Reassessment.   Loyola Mast, MD

## 2023-07-02 NOTE — Assessment & Plan Note (Signed)
On exam appears to be in normal sinus rhythm at present. Low risk for complications with disrupting anticoagulation around the time of surgery.

## 2023-07-02 NOTE — Patient Instructions (Signed)
Visit Information  Thank you for taking time to visit with me today. Please don't hesitate to contact me if I can be of assistance to you.   Following are the goals we discussed today:   Goals Addressed             This Visit's Progress    COMPLETED: Care Coordination Activities-No follow up required       Care Coordination Interventions: Advised patient to Annual Wellness exam. Discussed [facility] services and support. Assessed SDOH. Advised to discuss with primary care physician if services needed in the future.          If you are experiencing a Mental Health or Behavioral Health Crisis or need someone to talk to, please call the Suicide and Crisis Lifeline: 988   Patient verbalizes understanding of instructions and care plan provided today and agrees to view in MyChart. Active MyChart status and patient understanding of how to access instructions and care plan via MyChart confirmed with patient.     The patient has been provided with contact information for the care management team and has been advised to call with any health related questions or concerns.   Bary Leriche, RN, MSN [facility], [facility] Management Community Coordinator Direct Dial: [phone]  Fax: [phone] Website: [email or web address]

## 2023-07-02 NOTE — Assessment & Plan Note (Signed)
Low risk for complicatiosn of surgery. I will clear her to proceed. She shoudl hold her rivaroxaban (Xarelto) for 48 hours prior to surgery and can resume this the next day.

## 2023-07-06 ENCOUNTER — Telehealth: Payer: Self-pay | Admitting: Family Medicine

## 2023-07-06 ENCOUNTER — Encounter: Payer: Self-pay | Admitting: *Deleted

## 2023-07-06 DIAGNOSIS — Z170 Estrogen receptor positive status [ER+]: Secondary | ICD-10-CM | POA: Diagnosis not present

## 2023-07-06 DIAGNOSIS — Z853 Personal history of malignant neoplasm of breast: Secondary | ICD-10-CM | POA: Diagnosis not present

## 2023-07-06 DIAGNOSIS — C50411 Malignant neoplasm of upper-outer quadrant of right female breast: Secondary | ICD-10-CM | POA: Diagnosis not present

## 2023-07-06 DIAGNOSIS — Z08 Encounter for follow-up examination after completed treatment for malignant neoplasm: Secondary | ICD-10-CM | POA: Diagnosis not present

## 2023-07-06 DIAGNOSIS — Z1721 Progesterone receptor positive status: Secondary | ICD-10-CM | POA: Diagnosis not present

## 2023-07-06 DIAGNOSIS — Z1732 Human epidermal growth factor receptor 2 negative status: Secondary | ICD-10-CM | POA: Diagnosis not present

## 2023-07-06 NOTE — Telephone Encounter (Signed)
error 

## 2023-07-06 NOTE — Telephone Encounter (Signed)
Pt think she is suppose to be off of XARELTO 20 MG TABS tablet [[id number]]  because she is suppose to have a surgery for breast cancer. Please call the pt

## 2023-07-06 NOTE — Telephone Encounter (Signed)
Spoke to patient, she will hold xarelto for 48 hours prior to the surgery.  She was advised to hold it for 5 days prior to them placing the clip.  Advised her to follow their directions. Dm/cma

## 2023-07-06 NOTE — Progress Notes (Signed)
Seen by Dr Arrendondo in the [facility]. She wil have a lumpectomy on 07/20/2023 followed by radiation at [facility]. Any additional adjuvant treatment will be decided by surgical path.    Oncology Nurse Navigator Documentation     07/06/2023    2:00 PM  Oncology Nurse Navigator Flowsheets  Navigator Follow Up Date: 07/20/2023  Navigator Follow Up Reason: Surgery  Navigator Location [facility]  Navigator Encounter Type Appt/Treatment Plan Review  Patient Visit Type MedOnc  Treatment Phase Pre-Tx/Tx Discussion  Barriers/Navigation Needs Coordination of Care  Interventions None Required  Acuity Level 2-Minimal Needs (1-2 Barriers Identified)  Time Spent with Patient 15

## 2023-07-13 DIAGNOSIS — C50411 Malignant neoplasm of upper-outer quadrant of right female breast: Secondary | ICD-10-CM | POA: Diagnosis not present

## 2023-07-20 ENCOUNTER — Encounter: Payer: Self-pay | Admitting: *Deleted

## 2023-07-20 DIAGNOSIS — Z7901 Long term (current) use of anticoagulants: Secondary | ICD-10-CM | POA: Diagnosis not present

## 2023-07-20 DIAGNOSIS — Z853 Personal history of malignant neoplasm of breast: Secondary | ICD-10-CM | POA: Diagnosis not present

## 2023-07-20 DIAGNOSIS — Z170 Estrogen receptor positive status [ER+]: Secondary | ICD-10-CM | POA: Diagnosis not present

## 2023-07-20 DIAGNOSIS — C50411 Malignant neoplasm of upper-outer quadrant of right female breast: Secondary | ICD-10-CM | POA: Diagnosis not present

## 2023-07-20 DIAGNOSIS — I4811 Longstanding persistent atrial fibrillation: Secondary | ICD-10-CM | POA: Diagnosis not present

## 2023-07-20 DIAGNOSIS — N649 Disorder of breast, unspecified: Secondary | ICD-10-CM | POA: Diagnosis not present

## 2023-07-20 NOTE — Progress Notes (Signed)
Patient had technically successful RIGHT SAVI DIRECTED LUMPECTOMY today with Dr Lenox Ahr at [facility]. Will follow for path.   Oncology Nurse Navigator Documentation     07/20/2023    1:00 PM  Oncology Nurse Navigator Flowsheets  Phase of Treatment Surgery  Surgery Actual Start Date: 07/20/2023  Navigator Follow Up Date: 07/23/2023  Navigator Follow Up Reason: Pathology  Navigator Location [facility]  Navigator Encounter Type Appt/Treatment Plan Review  Treatment Initiated Date 07/20/2023  Patient Visit Type MedOnc  Treatment Phase Active Tx  Barriers/Navigation Needs Coordination of Care  Interventions None Required  Acuity Level 2-Minimal Needs (1-2 Barriers Identified)  Time Spent with Patient 15

## 2023-07-28 ENCOUNTER — Encounter: Payer: Self-pay | Admitting: *Deleted

## 2023-07-28 NOTE — Progress Notes (Unsigned)
Reviewed surgical pathology with Dr Myna Hidalgo. No additional path needs at this time. Patient needs a follow up in 2-3 weeks. Message sent to scheduling.   Oncology Nurse Navigator Documentation     07/28/2023    8:30 AM  Oncology Nurse Navigator Flowsheets  Navigator Follow Up Date: 08/18/2023  Navigator Follow Up Reason: Follow-up Appointment  Navigator Location [facility]  Navigator Encounter Type Pathology Review  Patient Visit Type MedOnc  Treatment Phase Active Tx  Barriers/Navigation Needs Coordination of Care  Interventions Coordination of Care  Acuity Level 2-Minimal Needs (1-2 Barriers Identified)  Coordination of Care Appts  Time Spent with Patient 15

## 2023-07-30 ENCOUNTER — Telehealth: Payer: Self-pay

## 2023-07-30 DIAGNOSIS — C50411 Malignant neoplasm of upper-outer quadrant of right female breast: Secondary | ICD-10-CM | POA: Diagnosis not present

## 2023-07-30 DIAGNOSIS — Z170 Estrogen receptor positive status [ER+]: Secondary | ICD-10-CM | POA: Diagnosis not present

## 2023-07-30 NOTE — Telephone Encounter (Signed)
Prolia approval extended. 09/29/23-09/27/24     Approval letter will be uploaded to media tab.

## 2023-08-04 DIAGNOSIS — C50411 Malignant neoplasm of upper-outer quadrant of right female breast: Secondary | ICD-10-CM | POA: Diagnosis not present

## 2023-08-04 DIAGNOSIS — Z170 Estrogen receptor positive status [ER+]: Secondary | ICD-10-CM | POA: Diagnosis not present

## 2023-08-10 ENCOUNTER — Encounter: Payer: Self-pay | Admitting: Hematology and Oncology

## 2023-08-11 DIAGNOSIS — Z853 Personal history of malignant neoplasm of breast: Secondary | ICD-10-CM | POA: Diagnosis not present

## 2023-08-11 DIAGNOSIS — C50911 Malignant neoplasm of unspecified site of right female breast: Secondary | ICD-10-CM | POA: Diagnosis not present

## 2023-08-11 DIAGNOSIS — C50411 Malignant neoplasm of upper-outer quadrant of right female breast: Secondary | ICD-10-CM | POA: Diagnosis not present

## 2023-08-11 DIAGNOSIS — Z170 Estrogen receptor positive status [ER+]: Secondary | ICD-10-CM | POA: Diagnosis not present

## 2023-08-17 DIAGNOSIS — Z170 Estrogen receptor positive status [ER+]: Secondary | ICD-10-CM | POA: Diagnosis not present

## 2023-08-17 DIAGNOSIS — C50411 Malignant neoplasm of upper-outer quadrant of right female breast: Secondary | ICD-10-CM | POA: Diagnosis not present

## 2023-08-18 ENCOUNTER — Encounter: Payer: Self-pay | Admitting: Hematology & Oncology

## 2023-08-18 ENCOUNTER — Encounter: Payer: Self-pay | Admitting: *Deleted

## 2023-08-18 ENCOUNTER — Inpatient Hospital Stay: Payer: Medicare PPO | Attending: Hematology & Oncology

## 2023-08-18 ENCOUNTER — Inpatient Hospital Stay (HOSPITAL_BASED_OUTPATIENT_CLINIC_OR_DEPARTMENT_OTHER): Payer: Medicare PPO | Admitting: Hematology & Oncology

## 2023-08-18 VITALS — BP 146/82 | HR 77 | Temp 98.6°F | Resp 18 | Ht 65.0 in | Wt 165.8 lb

## 2023-08-18 DIAGNOSIS — Z7901 Long term (current) use of anticoagulants: Secondary | ICD-10-CM | POA: Diagnosis not present

## 2023-08-18 DIAGNOSIS — Z1732 Human epidermal growth factor receptor 2 negative status: Secondary | ICD-10-CM | POA: Insufficient documentation

## 2023-08-18 DIAGNOSIS — C50411 Malignant neoplasm of upper-outer quadrant of right female breast: Secondary | ICD-10-CM | POA: Insufficient documentation

## 2023-08-18 DIAGNOSIS — Z170 Estrogen receptor positive status [ER+]: Secondary | ICD-10-CM | POA: Diagnosis not present

## 2023-08-18 DIAGNOSIS — E559 Vitamin D deficiency, unspecified: Secondary | ICD-10-CM

## 2023-08-18 DIAGNOSIS — I4891 Unspecified atrial fibrillation: Secondary | ICD-10-CM | POA: Insufficient documentation

## 2023-08-18 DIAGNOSIS — C50912 Malignant neoplasm of unspecified site of left female breast: Secondary | ICD-10-CM

## 2023-08-18 DIAGNOSIS — Z79811 Long term (current) use of aromatase inhibitors: Secondary | ICD-10-CM | POA: Insufficient documentation

## 2023-08-18 DIAGNOSIS — Z1721 Progesterone receptor positive status: Secondary | ICD-10-CM | POA: Diagnosis not present

## 2023-08-18 LAB — VITAMIN D 25 HYDROXY (VIT D DEFICIENCY, FRACTURES): Vit D, 25-Hydroxy: 44.54 ng/mL (ref 30–100)

## 2023-08-18 LAB — CMP (CANCER CENTER ONLY)
ALT: 12 U/L (ref 0–44)
AST: 19 U/L (ref 15–41)
Albumin: 3.9 g/dL (ref 3.5–5.0)
Alkaline Phosphatase: 48 U/L (ref 38–126)
Anion gap: 6 (ref 5–15)
BUN: 13 mg/dL (ref 8–23)
CO2: 30 mmol/L (ref 22–32)
Calcium: 9.7 mg/dL (ref 8.9–10.3)
Chloride: 104 mmol/L (ref 98–111)
Creatinine: 0.67 mg/dL (ref 0.44–1.00)
GFR, Estimated: >60 mL/min (ref >60)
Glucose, Bld: 85 mg/dL (ref 70–99)
Potassium: 4.3 mmol/L (ref 3.5–5.1)
Sodium: 140 mmol/L (ref 135–145)
Total Bilirubin: 0.6 mg/dL (ref <1.2)
Total Protein: 6.8 g/dL (ref 6.5–8.1)

## 2023-08-18 LAB — CBC WITH DIFFERENTIAL (CANCER CENTER ONLY)
Abs Immature Granulocytes: 0.02 10*3/uL (ref 0.00–0.07)
Basophils Absolute: 0 10*3/uL (ref 0.0–0.1)
Basophils Relative: 1 %
Eosinophils Absolute: 0.1 10*3/uL (ref 0.0–0.5)
Eosinophils Relative: 1 %
HCT: 40.6 % (ref 36.0–46.0)
Hemoglobin: 13.2 g/dL (ref 12.0–15.0)
Immature Granulocytes: 0 %
Lymphocytes Relative: 36 %
Lymphs Abs: 2.4 10*3/uL (ref 0.7–4.0)
MCH: 32 pg (ref 26.0–34.0)
MCHC: 32.5 g/dL (ref 30.0–36.0)
MCV: 98.3 fL (ref 80.0–100.0)
Monocytes Absolute: 0.6 10*3/uL (ref 0.1–1.0)
Monocytes Relative: 9 %
Neutro Abs: 3.5 10*3/uL (ref 1.7–7.7)
Neutrophils Relative %: 53 %
Platelet Count: 265 10*3/uL (ref 150–400)
RBC: 4.13 MIL/uL (ref 3.87–5.11)
RDW: 13.8 % (ref 11.5–15.5)
WBC Count: 6.6 10*3/uL (ref 4.0–10.5)
nRBC: 0 % (ref 0.0–0.2)

## 2023-08-18 NOTE — Progress Notes (Unsigned)
Initial RN Navigator Patient Visit  Name: Nicole Guzman Diagnosis:  Met with patient prior to their visit with MD. Jovita Gamma patient "Your Patient Navigator" handout which explains my role, areas in which I am able to help, and all the contact information for myself and the office. Also gave patient MD and Navigator business card.   New patient packet given to patient which includes: orientation to office and staff; campus directory; education on My Chart and Advance Directives; and patient centered education on breast cancer.   Patient was an established patient with a previous diagnosis of breast cancer. She presents today with a new breast cancer. She had her lumpectomy on 07/20/2023 at [facility]. She also had genetic testing with [facility]. Oncotype completed by [facility] was 15.  Patient has already completed simulation for radiation with Dr Donne Hazel at [facility] and radiation should start the first week of December.   Referrals to nutrition and dietary placed per protocol.   Patient completed visit with Dr. Myna Hidalgo. Plan for AI once radiation is complete.   Patient understands all follow up procedures and expectations. They have my number to reach out for any further clarification or additional needs.   Oncology Nurse Navigator Documentation     08/18/2023   12:30 PM  Oncology Nurse Navigator Flowsheets  Navigator Follow Up Date: 10/05/2023  Navigator Follow Up Reason: Follow-up Appointment  Navigator Location [facility]  Navigator Encounter Type Initial MedOnc  Patient Visit Type MedOnc  Treatment Phase Active Tx  Barriers/Navigation Needs Coordination of Care  Education Newly Diagnosed Cancer Education;Pain/ Symptom Management  Interventions Education;Psycho-Social Support;Referrals  Acuity Level 2-Minimal Needs (1-2 Barriers Identified)  Referrals Nutrition/dietician;Social Work  Education Method Verbal;Written  Support Groups/Services Friends and Family   Time Spent with Patient 30  Genetic Counseling Type Non-Urgent  Genetic Counseling Date 08/05/2023

## 2023-08-18 NOTE — Progress Notes (Signed)
Hematology and Oncology Follow Up Visit  Nicole Guzman [id number] September 27, 1941 82 y.o. 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 daily     Interim History:  Nicole Guzman is back for follow-up.  Presently, she actually had another diagnosis of breast cancer.  This is of the right breast.  She had a routine mammogram that was done.  This showed a suspicious calcification in the right breast at the 10 o'clock position.  On 06/03/2023, she had a breast biopsy.  The pathology report ([id number]) and invasive moderately differentiated adenocarcinoma.  It was grade 2.  There was no angiolymphatic invasion..  She subsequently underwent a lobectomy.  I think this was done at [facility].  Dr. Marisue Humble did the surgery.  The pathology report ([id number]) showed a infiltrating ductal carcinoma.  It measured only 1.1 cm.  There was all negative margins.  There is no lymphovascular invasion.  There is no DCIS.  She is seen Radiation Oncology.  Etc. she will start her radiation therapy in December.  She is healed up quite nicely.  She really has no specific complaints.  She really has had no problems with pain.  There is no swelling of the right arm.  I forgot to mention that the Oncotype score was 15.  She had no problems with bleeding.  She is on Xarelto for atrial fibrillation.  Overall, I would say that her performance status is ECOG 1.  .  Medications:  Current Outpatient Medications:    Calcium Carb-Cholecalciferol (CALCIUM 500/D) 500-400  MG-UNIT CHEW, Chew by mouth., Disp: , Rfl:    COMBIGAN 0.2-0.5 % ophthalmic solution, Apply 1 drop to eye 2 (two) times daily., Disp: , Rfl:    denosumab (PROLIA) 60 MG/ML SOSY injection, Inject 60 mg into the skin every 6 (six) months., Disp: 1 mL, Rfl: 1   dorzolamide (TRUSOPT) 2 % ophthalmic solution, Place 1 drop into both eyes 2 (two) times daily., Disp: , Rfl:    metoprolol tartrate (LOPRESSOR) 25 MG tablet, Take 1 tablet by mouth twice daily, Disp: 180 tablet, Rfl: 3   Multiple Vitamins-Minerals (MULTIVITAMIN WITH MINERALS) tablet, Take 1 tablet by mouth daily., Disp: , Rfl:    Travoprost, BAK Free, (TRAVATAN) 0.004 % SOLN ophthalmic solution, INSTILL 1 DROP INTO EACH EYE EVERY DAY AT BEDTIME, Disp: , Rfl:    vitamin C (ASCORBIC ACID) 500 MG tablet, Take 500 mg by mouth daily., Disp: , Rfl:    Vitamin D, Cholecalciferol, 25 MCG (1000 UT) TABS, Take by mouth., Disp: , Rfl:    XARELTO 20 MG TABS tablet, Take 1 tablet by mouth once daily, Disp: 30 tablet, Rfl: 11  Allergies: No Known Allergies  Past Medical History, Surgical history, Social history, and Family History were reviewed and updated.  Review of Systems: Review of Systems  Constitutional: Negative.   HENT:  Negative.    Eyes: Negative.   Respiratory: Negative.    Cardiovascular: Negative.   Gastrointestinal: Negative.   Endocrine: Negative.   Genitourinary: Negative.    Musculoskeletal: Negative.   Skin: Negative.  Neurological: Negative.   Hematological: Negative.   Psychiatric/Behavioral: Negative.      Physical Exam:  height is 5\' 5"  (1.651 m) and weight is 165 lb 12.8 oz (75.2 kg). Her oral temperature is 98.6 F (37 C). Her blood pressure is 146/82 (abnormal) and her pulse is 77. Her respiration is 18 and oxygen saturation is 98%.   Wt Readings from Last 3 Encounters:  08/18/23 165 lb 12.8 oz (75.2 kg)  07/02/23 163 lb 12.8 oz (74.3 kg)  03/30/23 157 lb (71.2 kg)    Physical Exam Vitals reviewed.   Constitutional:      Comments: Right breast shows the healing lumpectomy scar at about the 10 o'clock position.  She has no erythema.  There is no swelling.  There is no nipple discharge.  There is no right axillary adenopathy. .   Left breast shows changes from lumpectomy and radiation.  She has the lumpectomy scar at about the 8 o'clock position.  This is well-healed.  There is no left axillary adenopathy.  HENT:     Head: Normocephalic and atraumatic.  Eyes:     Pupils: Pupils are equal, round, and reactive to light.  Cardiovascular:     Rate and Rhythm: Normal rate and regular rhythm.     Heart sounds: Normal heart sounds.  Pulmonary:     Effort: Pulmonary effort is normal.     Breath sounds: Normal breath sounds.  Abdominal:     General: Bowel sounds are normal.     Palpations: Abdomen is soft.  Musculoskeletal:        General: No tenderness or deformity. Normal range of motion.     Cervical back: Normal range of motion.  Lymphadenopathy:     Cervical: No cervical adenopathy.  Skin:    General: Skin is warm and dry.     Findings: No erythema or rash.  Neurological:     Mental Status: She is alert and oriented to person, place, and time.  Psychiatric:        Behavior: Behavior normal.        Thought Content: Thought content normal.        Judgment: Judgment normal.      Lab Results  Component Value Date   WBC 6.6 08/18/2023   HGB 13.2 08/18/2023   HCT 40.6 08/18/2023   MCV 98.3 08/18/2023   PLT 265 08/18/2023     Chemistry      Component Value Date/Time   NA 139 03/30/2023 1139   K 4.7 03/30/2023 1139   CL 102 03/30/2023 1139   CO2 33 (H) 03/30/2023 1139   BUN 13 03/30/2023 1139   CREATININE 0.71 03/30/2023 1139      Component Value Date/Time   CALCIUM 10.2 03/30/2023 1139   ALKPHOS 53 03/30/2023 1139   AST 17 03/30/2023 1139   ALT 14 03/30/2023 1139   BILITOT 0.7 03/30/2023 1139      Impression and Plan: Nicole Guzman is a very nice 82 year old  postmenopausal white female.  She has had history of stage IIa infiltrating lobular carcinoma of the left breast.  She had treatment for this.  This was about 11 years ago with radiation and surgery.  She was on Arimidex.  We stopped the Arimidex in October.  She now has a new breast cancer.  This is a stage I ductal carcinoma the right breast.  She will have radiation therapy.  Once she finishes radiation, we will put her on Aromasin.  I think  this would be a good option for her.  I am just that she has another breast cancer.  She has history manage pretty well.  She looks fantastic.  We will plan to get her back to see [place] after the New Year.  At that time, we will then get her on Aromasin.    Josph Macho, MD 11/20/202412:41 PM

## 2023-08-20 ENCOUNTER — Ambulatory Visit: Payer: Medicare PPO | Admitting: Family Medicine

## 2023-08-23 ENCOUNTER — Ambulatory Visit (INDEPENDENT_AMBULATORY_CARE_PROVIDER_SITE_OTHER): Payer: Medicare PPO | Admitting: Family Medicine

## 2023-08-23 ENCOUNTER — Encounter: Payer: Self-pay | Admitting: Family Medicine

## 2023-08-23 VITALS — BP 122/74 | HR 74 | Temp 98.0°F | Ht 65.0 in | Wt 166.0 lb

## 2023-08-23 DIAGNOSIS — C50411 Malignant neoplasm of upper-outer quadrant of right female breast: Secondary | ICD-10-CM | POA: Diagnosis not present

## 2023-08-23 DIAGNOSIS — Z170 Estrogen receptor positive status [ER+]: Secondary | ICD-10-CM

## 2023-08-23 DIAGNOSIS — Z9889 Other specified postprocedural states: Secondary | ICD-10-CM | POA: Insufficient documentation

## 2023-08-23 DIAGNOSIS — M818 Other osteoporosis without current pathological fracture: Secondary | ICD-10-CM | POA: Diagnosis not present

## 2023-08-23 NOTE — Assessment & Plan Note (Signed)
Healed

## 2023-08-23 NOTE — Assessment & Plan Note (Signed)
Last DXA scan 08/2022 showed values in an osteopenic range. 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 No longer on an AI. Currently on calcium and Vitamin D supplementation. Treated with Prolia 60 mg IM q 6 months. Next injection due in Jan.

## 2023-08-23 NOTE — Progress Notes (Signed)
[facility] [address] [place] [address] Dept: [phone] Dept Fax: [phone]  Chronic Care Office Visit  Subjective:    Patient ID: Nicole Guzman, female    DOB: 09-20-1941, 82 y.o..   MRN: [id number]  Chief Complaint  Patient presents with   Follow-up    6 month f/u. No c/o.     History of Present Illness:  Patient is in today for reassessment of chronic medical issues.  Nicole Guzman has a history of prior left breast cancer. At her annual mammogram on 05/18/2023 , she was noted to have a new right breast mass. She underwent further diagnostic studies and biopsies that shows she has an HER2-  adenocarcinoma of the right breast. She underwent lumpectomy on 07/20/2023. She notes her surgical wound has healed well. She is scheduled to start a course of 5-days of radiation therapy next week. She belives she will then be on oral medication afterwards.   Nicole Guzman has a history of osteoporosis related to her age and prior use of aromatase inhibitors. She is currently on Prolia and is no longer on her AI. She is taking her calcium/Vit. D and a separate Vitamin D supplement.  Past Medical History: Patient Active Problem List   Diagnosis Date Noted   Preoperative examination 07/02/2023   Chronic anticoagulation 06/28/2023   Malignant neoplasm of upper-outer quadrant of right breast in female, estrogen receptor positive (HCC) 06/28/2023   Combined forms of age-related cataract of left eye 06/21/2023   Combined forms of age-related cataract of right eye 06/21/2023   Primary open angle glaucoma (POAG) of right eye, severe stage 04/26/2023   Other chest pain 11/25/2022   Stage II breast cancer, left (HCC) 01/16/2021   Borderline hyperlipidemia 12/26/2020   Vitamin D deficiency 12/26/2020   Allergic rhinitis 12/26/2020   Diverticulosis 12/26/2020   History of breast cancer 12/11/2020   Glaucoma 12/11/2020   Atrial  fibrillation (HCC) 12/11/2020   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     COMBIGAN 0.2-0.5 % ophthalmic solution Apply 1 drop to eye 2 (two) times daily.     denosumab (PROLIA) 60 MG/ML SOSY injection Inject 60 mg into the skin every 6 (six) months. 1 mL 1   Dermatological Products, Misc. (STRATA XRT) GEL Apply topically 2 (two) times daily.     dorzolamide (TRUSOPT) 2 % ophthalmic solution Place 1 drop into both eyes 2 (two) times daily.     metoprolol tartrate (LOPRESSOR) 25 MG tablet Take 1 tablet by mouth twice daily 180 tablet 3   Multiple Vitamins-Minerals (MULTIVITAMIN WITH MINERALS) tablet Take 1 tablet by mouth daily.     Travoprost, BAK Free, (TRAVATAN) 0.004 % SOLN ophthalmic solution INSTILL 1 DROP INTO EACH EYE EVERY DAY AT BEDTIME     triamcinolone ointment (KENALOG) 0.1 % Apply  topically 2 (two) times daily.     vitamin C (ASCORBIC ACID) 500 MG tablet Take 500 mg by mouth daily.     Vitamin D, Cholecalciferol, 25 MCG (1000 UT) TABS Take by mouth.     XARELTO 20 MG TABS tablet Take 1 tablet by mouth once daily 30 tablet 11   No facility-administered medications prior to visit.   No Known Allergies Objective:   Today's Vitals   08/23/23 0952  BP: 122/74  Pulse: 74  Temp: 98 F (36.7 C)  TempSrc: Temporal  SpO2: 98%  Weight: 166 lb (75.3 kg)  Height: 5\' 5"  (1.651 m)   Body mass index is  27.62 kg/m.   General: Well developed, well nourished. No acute distress. Psych: Alert and oriented. Normal mood and affect.  Health Maintenance Due  Topic Date Due   DTaP/Tdap/Td (1 - Tdap) Never done   Pneumonia Vaccine 73+ Years old (1 of 1 - PCV) Never done     Assessment & Plan:   Problem List Items Addressed This Visit       Musculoskeletal and Integument   Age-related osteoporosis without fracture    Last DXA scan 08/2022 showed values in an osteopenic range. 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 No longer on an AI. Currently on calcium and Vitamin D supplementation. Treated with Prolia 60 mg IM q 6 months. Next injection due in Jan.        Other   Malignant neoplasm of upper-outer quadrant of right breast in female, estrogen receptor positive (HCC)    Completed lumpectomy. Doing well. Scheduled for radiation therapy next week.      Status post right breast lumpectomy - Primary    Healed.       Return for Follow-up as scheduled.   Loyola Mast, MD

## 2023-08-23 NOTE — Assessment & Plan Note (Signed)
Completed lumpectomy. Doing well. Scheduled for radiation therapy next week.

## 2023-08-24 ENCOUNTER — Inpatient Hospital Stay: Payer: Medicare PPO

## 2023-08-24 NOTE — Progress Notes (Signed)
[facility] Clinical Social Work  Initial Assessment   Kalyce Balek is a 82 y.o. year old female contacted by phone. Clinical Social Work was referred by nurse navigator for assessment of psychosocial needs.   SDOH (Social Determinants of Health) assessments performed: Yes SDOH Interventions    Flowsheet Row Clinical Support from 02/26/2023 in [facility] Telephone from 11/23/2022 in [facility] [employer] Clinical Support from 02/17/2022 in [facility] [employer] Clinical Support from 02/11/2021 in [facility] [employer]  SDOH Interventions      Food Insecurity Interventions Intervention Not Indicated -- Intervention Not Indicated --  Housing Interventions Intervention Not Indicated -- Intervention Not Indicated --  Transportation Interventions Intervention Not Indicated -- Intervention Not Indicated --  Utilities Interventions Intervention Not Indicated -- -- --  Alcohol Usage Interventions Intervention Not Indicated (Score <7) -- -- --  Financial Strain Interventions Intervention Not Indicated -- Intervention Not Indicated --  Physical Activity Interventions Intervention Not Indicated Patient Refused Intervention Not Indicated Intervention Not Indicated  [pt has set a goal to increase activity]  Stress Interventions Intervention Not Indicated -- Intervention Not Indicated --  Social Connections Interventions Intervention Not Indicated -- Intervention Not Indicated --       SDOH Screenings   Food Insecurity: No Food Insecurity (08/19/2023)  Housing: Low Risk  (08/19/2023)  Transportation Needs: No Transportation Needs (08/19/2023)  Utilities: Not At Risk (02/26/2023)  Alcohol Screen: Low Risk  (02/17/2022)  Depression (PHQ2-9): Low Risk  (07/02/2023)  Financial Resource Strain: Low Risk  (08/19/2023)  Physical Activity: Insufficiently Active (08/19/2023)  Social  Connections: Moderately Integrated (08/19/2023)  Stress: No Stress Concern Present (08/19/2023)  Tobacco Use: Medium Risk (08/23/2023)     Distress Screen completed: No     No data to display            Family/Social Information:  Housing Arrangement: patient lives alone Family members/support persons in your life? Family, Church, and [place]. One of patient's sons lives in [place] and the other lives in [place]. Transportation concerns: No.  Patient drives herself. Employment: Retired Income source: Actor concerns: No Type of concern: None Food access concerns: no Religious or spiritual practice: Yes-Patient attends church with her son and his family. Services Currently in place:  Medicare  Coping/ Adjustment to diagnosis: Patient understands treatment plan and what happens next? yes Concerns about diagnosis and/or treatment: I'm not especially worried about anything Patient reported stressors:  None per patient. Hopes and/or priorities: Family Patient enjoys time with family/ friends Current coping skills/ strengths: Active sense of humor , Average or above average intelligence , Capable of independent living , Communication skills , Contractor , General fund of knowledge , Religious Affiliation , and Supportive family/friends     SUMMARY: Current SDOH Barriers:  None per patient.  Clinical Social Work Clinical Goal(s):  No clinical social work goals at this time  Interventions: Discussed common feeling and emotions when being diagnosed with cancer, and the importance of support during treatment Informed patient of the support team roles and support services at [facility] Provided CSW contact information and encouraged patient to call with any questions or concerns Provided patient with information about the [employer] and will mail her literature.   Follow Up Plan: Patient will contact CSW with any support or resource  needs Patient verbalizes understanding of plan: Yes    Dorothey Baseman, LCSW Clinical Social Worker  [facility]

## 2023-08-25 ENCOUNTER — Inpatient Hospital Stay: Payer: Medicare PPO | Admitting: Dietician

## 2023-08-25 NOTE — Progress Notes (Signed)
Nutrition Assessment: Reached out to patient at home telephone number.    Reason for Assessment: New Patient referral   ASSESSMENT: Patient is an 82 year old female who has been known to Dr. Myna Hidalgo for history of prior left breast cancer but recently diagnosed with a HER2-  adenocarcinoma of the right breast. She underwent lumpectomy on 07/20/2023. She notes her surgical wound has healed well. She is scheduled to start a course of 5-days of radiation therapy next week. Dr. Myna Hidalgo plans to start patient on Aromasin when she finishes radiation. Patient reports no concerns. She has no known food allergies. She has a good appetite, loves vegetables.  Loves to cook but lives alone, doesn't bake much. Eats 3 meals a day, not a big snacker, will snack on fruit and occasional ice cream. Lives in safe neighborhood and walks 3 times a week. Breakfast: frittatas, or peppers eggs and cheese, or tortilla, or cereal Lunch: Sandwich, or soup and salad, or leftovers Dinner: Winter squash sweet potatoes, root vegetables with chicken. tries to eat healthy Fluid: water mostly (with lemon), tea, clear choice flavored water, diet Dr. Reino Kent, no alcohol, likes vanilla yogurts (twice a week)   Nutrition Focused Physical Exam: unable to perform NFPE   Medications: MVI, Ca with Vit D, [employer] brand Presser Vit, stopped taking Vit C.,    Labs: reviewed 08/18/23   Anthropometrics: no significant changes  Height: 65" Weight: 166# UBW: 155-165 BMI: 27.62    NUTRITION DIAGNOSIS: no concerns at this time, interventions for information only   INTERVENTION:   Relayed that nutrition services are wrap around service provided at no charge and encouraged continued communication if experiencing any nutritional impact symptoms (NIS). Encouraged her to continue to refrain from taking supplemental Vit C during radiation treatments. Relayed information regarding programs that [employer] provides including cooking  classes. Contact information provided.  MONITORING, EVALUATION, GOAL: weight trends, nutrition impact symptoms, PO intake, labs   Next Visit: PRN at patient or provider request.  Gennaro Africa, RDN, LDN Registered Dietitian, [facility] Part Time Remote (Usual office hours: Tuesday-Thursday) Mobile: [phone]

## 2023-08-30 DIAGNOSIS — Z510 Encounter for antineoplastic radiation therapy: Secondary | ICD-10-CM | POA: Diagnosis not present

## 2023-08-30 DIAGNOSIS — Z1721 Progesterone receptor positive status: Secondary | ICD-10-CM | POA: Diagnosis not present

## 2023-08-30 DIAGNOSIS — Z1732 Human epidermal growth factor receptor 2 negative status: Secondary | ICD-10-CM | POA: Diagnosis not present

## 2023-08-30 DIAGNOSIS — C50411 Malignant neoplasm of upper-outer quadrant of right female breast: Secondary | ICD-10-CM | POA: Diagnosis not present

## 2023-08-30 DIAGNOSIS — Z170 Estrogen receptor positive status [ER+]: Secondary | ICD-10-CM | POA: Diagnosis not present

## 2023-09-01 DIAGNOSIS — Z1721 Progesterone receptor positive status: Secondary | ICD-10-CM | POA: Diagnosis not present

## 2023-09-01 DIAGNOSIS — Z170 Estrogen receptor positive status [ER+]: Secondary | ICD-10-CM | POA: Diagnosis not present

## 2023-09-01 DIAGNOSIS — Z510 Encounter for antineoplastic radiation therapy: Secondary | ICD-10-CM | POA: Diagnosis not present

## 2023-09-01 DIAGNOSIS — Z1732 Human epidermal growth factor receptor 2 negative status: Secondary | ICD-10-CM | POA: Diagnosis not present

## 2023-09-01 DIAGNOSIS — C50411 Malignant neoplasm of upper-outer quadrant of right female breast: Secondary | ICD-10-CM | POA: Diagnosis not present

## 2023-09-02 DIAGNOSIS — Z170 Estrogen receptor positive status [ER+]: Secondary | ICD-10-CM | POA: Diagnosis not present

## 2023-09-02 DIAGNOSIS — Z510 Encounter for antineoplastic radiation therapy: Secondary | ICD-10-CM | POA: Diagnosis not present

## 2023-09-02 DIAGNOSIS — Z1721 Progesterone receptor positive status: Secondary | ICD-10-CM | POA: Diagnosis not present

## 2023-09-02 DIAGNOSIS — Z1732 Human epidermal growth factor receptor 2 negative status: Secondary | ICD-10-CM | POA: Diagnosis not present

## 2023-09-02 DIAGNOSIS — C50411 Malignant neoplasm of upper-outer quadrant of right female breast: Secondary | ICD-10-CM | POA: Diagnosis not present

## 2023-09-03 DIAGNOSIS — Z170 Estrogen receptor positive status [ER+]: Secondary | ICD-10-CM | POA: Diagnosis not present

## 2023-09-03 DIAGNOSIS — C50411 Malignant neoplasm of upper-outer quadrant of right female breast: Secondary | ICD-10-CM | POA: Diagnosis not present

## 2023-09-03 DIAGNOSIS — Z510 Encounter for antineoplastic radiation therapy: Secondary | ICD-10-CM | POA: Diagnosis not present

## 2023-09-03 DIAGNOSIS — Z1721 Progesterone receptor positive status: Secondary | ICD-10-CM | POA: Diagnosis not present

## 2023-09-03 DIAGNOSIS — Z1732 Human epidermal growth factor receptor 2 negative status: Secondary | ICD-10-CM | POA: Diagnosis not present

## 2023-09-06 DIAGNOSIS — Z1721 Progesterone receptor positive status: Secondary | ICD-10-CM | POA: Diagnosis not present

## 2023-09-06 DIAGNOSIS — Z510 Encounter for antineoplastic radiation therapy: Secondary | ICD-10-CM | POA: Diagnosis not present

## 2023-09-06 DIAGNOSIS — Z1732 Human epidermal growth factor receptor 2 negative status: Secondary | ICD-10-CM | POA: Diagnosis not present

## 2023-09-06 DIAGNOSIS — Z170 Estrogen receptor positive status [ER+]: Secondary | ICD-10-CM | POA: Diagnosis not present

## 2023-09-06 DIAGNOSIS — C50411 Malignant neoplasm of upper-outer quadrant of right female breast: Secondary | ICD-10-CM | POA: Diagnosis not present

## 2023-09-07 DIAGNOSIS — Z170 Estrogen receptor positive status [ER+]: Secondary | ICD-10-CM | POA: Diagnosis not present

## 2023-09-07 DIAGNOSIS — C50411 Malignant neoplasm of upper-outer quadrant of right female breast: Secondary | ICD-10-CM | POA: Diagnosis not present

## 2023-09-07 DIAGNOSIS — Z1732 Human epidermal growth factor receptor 2 negative status: Secondary | ICD-10-CM | POA: Diagnosis not present

## 2023-09-07 DIAGNOSIS — Z510 Encounter for antineoplastic radiation therapy: Secondary | ICD-10-CM | POA: Diagnosis not present

## 2023-09-07 DIAGNOSIS — Z1721 Progesterone receptor positive status: Secondary | ICD-10-CM | POA: Diagnosis not present

## 2023-09-10 ENCOUNTER — Other Ambulatory Visit: Payer: Self-pay

## 2023-09-26 ENCOUNTER — Other Ambulatory Visit: Payer: Self-pay | Admitting: Family Medicine

## 2023-09-26 DIAGNOSIS — I4811 Longstanding persistent atrial fibrillation: Secondary | ICD-10-CM

## 2023-10-01 ENCOUNTER — Other Ambulatory Visit (HOSPITAL_COMMUNITY): Payer: Self-pay

## 2023-10-01 ENCOUNTER — Other Ambulatory Visit (HOSPITAL_COMMUNITY): Payer: Self-pay | Admitting: Pharmacy Technician

## 2023-10-01 NOTE — Progress Notes (Signed)
 Specialty Pharmacy Refill Coordination Note  Nicole Guzman is a 83 y.o. female contacted today regarding refills of specialty medication(s) Denosumab  (Prolia )   Patient requested Courier to Provider Office   Delivery date: 10/06/23   Verified address: [address] [facility]   Medication will be filled on 10/05/23.

## 2023-10-05 ENCOUNTER — Other Ambulatory Visit: Payer: Self-pay

## 2023-10-05 ENCOUNTER — Inpatient Hospital Stay (HOSPITAL_BASED_OUTPATIENT_CLINIC_OR_DEPARTMENT_OTHER): Payer: Medicare PPO | Admitting: Hematology & Oncology

## 2023-10-05 ENCOUNTER — Inpatient Hospital Stay: Payer: Medicare PPO | Attending: Hematology & Oncology

## 2023-10-05 ENCOUNTER — Encounter: Payer: Self-pay | Admitting: *Deleted

## 2023-10-05 ENCOUNTER — Encounter: Payer: Self-pay | Admitting: Hematology & Oncology

## 2023-10-05 VITALS — BP 150/89 | HR 70 | Temp 98.1°F | Resp 18 | Ht 64.0 in | Wt 167.0 lb

## 2023-10-05 DIAGNOSIS — Z923 Personal history of irradiation: Secondary | ICD-10-CM | POA: Insufficient documentation

## 2023-10-05 DIAGNOSIS — I4891 Unspecified atrial fibrillation: Secondary | ICD-10-CM | POA: Insufficient documentation

## 2023-10-05 DIAGNOSIS — Z7901 Long term (current) use of anticoagulants: Secondary | ICD-10-CM | POA: Insufficient documentation

## 2023-10-05 DIAGNOSIS — Z1721 Progesterone receptor positive status: Secondary | ICD-10-CM | POA: Diagnosis not present

## 2023-10-05 DIAGNOSIS — C50912 Malignant neoplasm of unspecified site of left female breast: Secondary | ICD-10-CM | POA: Diagnosis not present

## 2023-10-05 DIAGNOSIS — C50911 Malignant neoplasm of unspecified site of right female breast: Secondary | ICD-10-CM | POA: Insufficient documentation

## 2023-10-05 DIAGNOSIS — Z79811 Long term (current) use of aromatase inhibitors: Secondary | ICD-10-CM | POA: Insufficient documentation

## 2023-10-05 DIAGNOSIS — C50411 Malignant neoplasm of upper-outer quadrant of right female breast: Secondary | ICD-10-CM | POA: Diagnosis present

## 2023-10-05 DIAGNOSIS — Z170 Estrogen receptor positive status [ER+]: Secondary | ICD-10-CM | POA: Insufficient documentation

## 2023-10-05 DIAGNOSIS — Z1732 Human epidermal growth factor receptor 2 negative status: Secondary | ICD-10-CM | POA: Diagnosis not present

## 2023-10-05 LAB — CMP (CANCER CENTER ONLY)
ALT: 13 U/L (ref 0–44)
AST: 19 U/L (ref 15–41)
Albumin: 4.1 g/dL (ref 3.5–5.0)
Alkaline Phosphatase: 53 U/L (ref 38–126)
Anion gap: 6 (ref 5–15)
BUN: 10 mg/dL (ref 8–23)
CO2: 33 mmol/L — ABNORMAL HIGH (ref 22–32)
Calcium: 10.3 mg/dL (ref 8.9–10.3)
Chloride: 103 mmol/L (ref 98–111)
Creatinine: 0.73 mg/dL (ref 0.44–1.00)
GFR, Estimated: >60 mL/min (ref >60)
Glucose, Bld: 84 mg/dL (ref 70–99)
Potassium: 4.5 mmol/L (ref 3.5–5.1)
Sodium: 142 mmol/L (ref 135–145)
Total Bilirubin: 0.7 mg/dL (ref 0.0–1.2)
Total Protein: 7.1 g/dL (ref 6.5–8.1)

## 2023-10-05 LAB — CBC WITH DIFFERENTIAL (CANCER CENTER ONLY)
Abs Immature Granulocytes: 0.01 10*3/uL (ref 0.00–0.07)
Basophils Absolute: 0 10*3/uL (ref 0.0–0.1)
Basophils Relative: 1 %
Eosinophils Absolute: 0.1 10*3/uL (ref 0.0–0.5)
Eosinophils Relative: 1 %
HCT: 41.6 % (ref 36.0–46.0)
Hemoglobin: 13.7 g/dL (ref 12.0–15.0)
Immature Granulocytes: 0 %
Lymphocytes Relative: 24 %
Lymphs Abs: 1.3 10*3/uL (ref 0.7–4.0)
MCH: 32.6 pg (ref 26.0–34.0)
MCHC: 32.9 g/dL (ref 30.0–36.0)
MCV: 99 fL (ref 80.0–100.0)
Monocytes Absolute: 0.6 10*3/uL (ref 0.1–1.0)
Monocytes Relative: 11 %
Neutro Abs: 3.3 10*3/uL (ref 1.7–7.7)
Neutrophils Relative %: 63 %
Platelet Count: 208 10*3/uL (ref 150–400)
RBC: 4.2 MIL/uL (ref 3.87–5.11)
RDW: 14.1 % (ref 11.5–15.5)
WBC Count: 5.3 10*3/uL (ref 4.0–10.5)
nRBC: 0 % (ref 0.0–0.2)

## 2023-10-05 MED ORDER — EXEMESTANE 25 MG PO TABS: 25.0000 mg | ORAL_TABLET | Freq: Every day | ORAL | 12 refills | Status: DC

## 2023-10-05 NOTE — Progress Notes (Signed)
 Hematology and Oncology Follow Up Visit  Nicole Guzman [id number] 1941/03/28 83 y.o. 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 daily     Interim History:  Nicole Guzman is back for follow-up.  She is doing quite nicely.  She had no problems over the Holiday season.  She continues on Xarelto  for the atrial fibrillation.  She had no problems with the radiation therapy.  She completed 5 treatments.  We will now get her on Aromasin .  I think this would be reasonable.  I think she would be able to tolerate this.  We will send this in.  She has had no problems with cough.  There is been no nausea or vomiting.  She has had no change in bowel or bladder habits.  She has had no fever.  There has been no leg swelling.  She has had no headache.  Thankfully, there has been no problems with COVID.  Overall, I would say that her performance status is probably ECOG 1. .  Medications:  Current Outpatient Medications:    Calcium Carb-Cholecalciferol (CALCIUM 500/D) 500-400 MG-UNIT CHEW, Chew by mouth., Disp: , Rfl:    COMBIGAN 0.2-0.5 % ophthalmic solution, Apply 1 drop to eye 2 (two) times daily., Disp: , Rfl:    denosumab  (PROLIA ) 60 MG/ML SOSY injection, Inject 60 mg into the skin every 6 (six) months., Disp: 1 mL, Rfl: 1   dorzolamide (TRUSOPT) 2 % ophthalmic solution, Place 1 drop into both eyes 2 (two) times daily., Disp: , Rfl:    metoprolol  tartrate (LOPRESSOR ) 25 MG tablet, Take 1 tablet by mouth twice daily, Disp: 180 tablet, Rfl:  3   Multiple Vitamins-Minerals (MULTIVITAMIN WITH MINERALS) tablet, Take 1 tablet by mouth daily., Disp: , Rfl:    Travoprost, BAK Free, (TRAVATAN) 0.004 % SOLN ophthalmic solution, INSTILL 1 DROP INTO EACH EYE EVERY DAY AT BEDTIME, Disp: , Rfl:    vitamin C (ASCORBIC ACID) 500 MG tablet, Take 500 mg by mouth daily., Disp: , Rfl:    Vitamin D , Cholecalciferol, 25 MCG (1000 UT) TABS, Take by mouth., Disp: , Rfl:    XARELTO  20 MG TABS tablet, Take 1 tablet by mouth once daily, Disp: 30 tablet, Rfl: 11  Allergies: No Known Allergies  Past Medical History, Surgical history, Social history, and Family History were reviewed and updated.  Review of Systems: Review of Systems  Constitutional: Negative.   HENT:  Negative.    Eyes: Negative.   Respiratory: Negative.    Cardiovascular: Negative.   Gastrointestinal: Negative.   Endocrine: Negative.   Genitourinary: Negative.    Musculoskeletal: Negative.   Skin: Negative.   Neurological: Negative.   Hematological: Negative.   Psychiatric/Behavioral: Negative.      Physical Exam:  height is 5' 4 (1.626 m) and weight is 167 lb (75.8 kg). Her oral temperature is 98.1 F (36.7 C). Her blood pressure is 150/89 (abnormal) and her pulse is 70. Her respiration is 18 and oxygen saturation is 99%.   Wt Readings from Last 3 Encounters:  10/05/23 167 lb (75.8 kg)  08/23/23 166 lb (75.3 kg)  08/18/23 165 lb 12.8 oz (75.2 kg)    Physical Exam Vitals reviewed.  Constitutional:      Comments: Right breast shows the healing lumpectomy scar at about the 10 o'clock position.  She has no erythema.  There is no swelling.  There is no nipple discharge.  There is no right axillary adenopathy. .   Left breast shows changes from lumpectomy and radiation.  She has the lumpectomy scar at about the 8 o'clock position.  This is well-healed.  There is no left axillary adenopathy.  HENT:     Head: Normocephalic and atraumatic.  Eyes:     Pupils: Pupils are equal,  round, and reactive to light.  Cardiovascular:     Rate and Rhythm: Normal rate and regular rhythm.     Heart sounds: Normal heart sounds.  Pulmonary:     Effort: Pulmonary effort is normal.     Breath sounds: Normal breath sounds.  Abdominal:     General: Bowel sounds are normal.     Palpations: Abdomen is soft.  Musculoskeletal:        General: No tenderness or deformity. Normal range of motion.     Cervical back: Normal range of motion.  Lymphadenopathy:     Cervical: No cervical adenopathy.  Skin:    General: Skin is warm and dry.     Findings: No erythema or rash.  Neurological:     Mental Status: She is alert and oriented to person, place, and time.  Psychiatric:        Behavior: Behavior normal.        Thought Content: Thought content normal.        Judgment: Judgment normal.      Lab Results  Component Value Date   WBC 5.3 10/05/2023   HGB 13.7 10/05/2023   HCT 41.6 10/05/2023   MCV 99.0 10/05/2023   PLT 208 10/05/2023     Chemistry      Component Value Date/Time   NA 142 10/05/2023 1006   K 4.5 10/05/2023 1006   CL 103 10/05/2023 1006   CO2 33 (H) 10/05/2023 1006   BUN 10 10/05/2023 1006   CREATININE 0.73 10/05/2023 1006      Component Value Date/Time   CALCIUM 10.3 10/05/2023 1006   ALKPHOS 53 10/05/2023 1006   AST 19 10/05/2023 1006   ALT 13 10/05/2023 1006   BILITOT 0.7 10/05/2023 1006      Impression and Plan: Nicole Guzman is a very nice 83 year old postmenopausal white female.  She has had history of stage IIa infiltrating lobular carcinoma of the left breast.  She had treatment for this.  This was about 11 years ago with radiation and surgery.  She was on Arimidex .  We stopped the Arimidex  in October.  She now has a new breast cancer.  This is a stage I ductal carcinoma the right breast.  She underwent radiotherapy for this.  We will go ahead and get her on Aromasin .  I really believe that she should have an excellent chance of  cure.  We will plan to get her back in about 6 weeks.  If all looks good in 6 weeks, then we will have her be seen every 3-4 months.    Maude JONELLE Crease, MD 1/7/202511:20 AM

## 2023-10-05 NOTE — Progress Notes (Signed)
 Patient has been doing well since being seen in November. She has completed radiation. She will now begin AI.   Oncology Nurse Navigator Documentation     10/05/2023   10:30 AM  Oncology Nurse Navigator Flowsheets  Phase of Treatment Radiation  Radiation Actual Start Date: 08/31/2023  Radiation Actual End Date: 09/07/2023  Navigator Follow Up Date: 11/23/2023  Navigator Follow Up Reason: Follow-up Appointment  Navigator Location [facility]  Navigator Encounter Type Follow-up Appt  Patient Visit Type MedOnc  Treatment Phase Active Tx  Barriers/Navigation Needs Coordination of Care  Interventions None Required  Acuity Level 2-Minimal Needs (1-2 Barriers Identified)  Support Groups/Services Friends and Family  Time Spent with Patient 15

## 2023-10-06 ENCOUNTER — Telehealth: Payer: Self-pay

## 2023-10-06 DIAGNOSIS — M818 Other osteoporosis without current pathological fracture: Secondary | ICD-10-CM

## 2023-10-06 NOTE — Telephone Encounter (Signed)
 Pt Prolia arrived to the office. Pt sched scheduled for NV 10/13/23.Marland Kitchen

## 2023-10-11 ENCOUNTER — Other Ambulatory Visit (HOSPITAL_COMMUNITY): Payer: Self-pay

## 2023-10-11 ENCOUNTER — Telehealth: Payer: Self-pay

## 2023-10-11 NOTE — Telephone Encounter (Signed)
 Prolia VOB initiated via [email or web address]

## 2023-10-13 ENCOUNTER — Ambulatory Visit: Payer: Medicare PPO

## 2023-10-13 DIAGNOSIS — M818 Other osteoporosis without current pathological fracture: Secondary | ICD-10-CM

## 2023-10-13 MED ORDER — DENOSUMAB 60 MG/ML ~~LOC~~ SOSY
60.0000 mg | PREFILLED_SYRINGE | Freq: Once | SUBCUTANEOUS | Status: AC
Start: 2023-10-13 — End: 2023-10-13
  Administered 2023-10-13: 60 mg via SUBCUTANEOUS

## 2023-10-13 MED ORDER — DENOSUMAB 60 MG/ML ~~LOC~~ SOSY
60.0000 mg | PREFILLED_SYRINGE | Freq: Once | SUBCUTANEOUS | Status: AC
Start: 2024-04-11 — End: 2024-04-19
  Administered 2024-04-19: 60 mg via SUBCUTANEOUS

## 2023-10-13 NOTE — Progress Notes (Signed)
 Patient presents for prolia  injection. Injection placed subcutaneous in left arm region. Patient tolerated procedure well with no concerns. Patient advised to return in 6 months around 04/11/2024 for next prolia  injection.

## 2023-10-28 NOTE — Telephone Encounter (Signed)
Pharmacy Patient Advocate Encounter  Received notification from [facility] that Prior Authorization for Prolia has been APPROVED from 09/29/23 to 09/27/24   PA #/Case ID/Reference #: [id number]

## 2023-11-23 ENCOUNTER — Inpatient Hospital Stay: Payer: Medicare PPO | Admitting: Hematology & Oncology

## 2023-11-23 ENCOUNTER — Encounter: Payer: Self-pay | Admitting: *Deleted

## 2023-11-23 ENCOUNTER — Other Ambulatory Visit: Payer: Self-pay

## 2023-11-23 ENCOUNTER — Inpatient Hospital Stay: Payer: Medicare PPO | Attending: Hematology & Oncology

## 2023-11-23 ENCOUNTER — Encounter: Payer: Self-pay | Admitting: Hematology & Oncology

## 2023-11-23 VITALS — BP 142/83 | HR 77 | Temp 98.0°F | Resp 18 | Ht 64.0 in | Wt 167.0 lb

## 2023-11-23 DIAGNOSIS — Z79811 Long term (current) use of aromatase inhibitors: Secondary | ICD-10-CM | POA: Insufficient documentation

## 2023-11-23 DIAGNOSIS — C50912 Malignant neoplasm of unspecified site of left female breast: Secondary | ICD-10-CM | POA: Diagnosis not present

## 2023-11-23 DIAGNOSIS — Z923 Personal history of irradiation: Secondary | ICD-10-CM | POA: Diagnosis not present

## 2023-11-23 DIAGNOSIS — Z1721 Progesterone receptor positive status: Secondary | ICD-10-CM | POA: Insufficient documentation

## 2023-11-23 DIAGNOSIS — Z1732 Human epidermal growth factor receptor 2 negative status: Secondary | ICD-10-CM | POA: Insufficient documentation

## 2023-11-23 DIAGNOSIS — C50911 Malignant neoplasm of unspecified site of right female breast: Secondary | ICD-10-CM | POA: Diagnosis not present

## 2023-11-23 DIAGNOSIS — I4891 Unspecified atrial fibrillation: Secondary | ICD-10-CM | POA: Diagnosis not present

## 2023-11-23 DIAGNOSIS — Z170 Estrogen receptor positive status [ER+]: Secondary | ICD-10-CM | POA: Diagnosis not present

## 2023-11-23 DIAGNOSIS — Z7901 Long term (current) use of anticoagulants: Secondary | ICD-10-CM | POA: Diagnosis not present

## 2023-11-23 LAB — CBC WITH DIFFERENTIAL (CANCER CENTER ONLY)
Abs Immature Granulocytes: 0.02 10*3/uL (ref 0.00–0.07)
Basophils Absolute: 0 10*3/uL (ref 0.0–0.1)
Basophils Relative: 1 %
Eosinophils Absolute: 0.1 10*3/uL (ref 0.0–0.5)
Eosinophils Relative: 1 %
HCT: 43.9 % (ref 36.0–46.0)
Hemoglobin: 14.2 g/dL (ref 12.0–15.0)
Immature Granulocytes: 0 %
Lymphocytes Relative: 24 %
Lymphs Abs: 1.5 10*3/uL (ref 0.7–4.0)
MCH: 32.1 pg (ref 26.0–34.0)
MCHC: 32.3 g/dL (ref 30.0–36.0)
MCV: 99.1 fL (ref 80.0–100.0)
Monocytes Absolute: 0.6 10*3/uL (ref 0.1–1.0)
Monocytes Relative: 10 %
Neutro Abs: 3.9 10*3/uL (ref 1.7–7.7)
Neutrophils Relative %: 64 %
Platelet Count: 266 10*3/uL (ref 150–400)
RBC: 4.43 MIL/uL (ref 3.87–5.11)
RDW: 13.9 % (ref 11.5–15.5)
WBC Count: 6.1 10*3/uL (ref 4.0–10.5)
nRBC: 0 % (ref 0.0–0.2)

## 2023-11-23 LAB — CMP (CANCER CENTER ONLY)
ALT: 12 U/L (ref 0–44)
AST: 18 U/L (ref 15–41)
Albumin: 4.2 g/dL (ref 3.5–5.0)
Alkaline Phosphatase: 46 U/L (ref 38–126)
Anion gap: 4 — ABNORMAL LOW (ref 5–15)
BUN: 14 mg/dL (ref 8–23)
CO2: 32 mmol/L (ref 22–32)
Calcium: 10.2 mg/dL (ref 8.9–10.3)
Chloride: 103 mmol/L (ref 98–111)
Creatinine: 0.7 mg/dL (ref 0.44–1.00)
GFR, Estimated: >60 mL/min (ref >60)
Glucose, Bld: 79 mg/dL (ref 70–99)
Potassium: 4.6 mmol/L (ref 3.5–5.1)
Sodium: 139 mmol/L (ref 135–145)
Total Bilirubin: 0.8 mg/dL (ref 0.0–1.2)
Total Protein: 7 g/dL (ref 6.5–8.1)

## 2023-11-23 NOTE — Progress Notes (Signed)
 Patient is doing well on her current AI. She will now be followed every three months.She has had no navigational needs for sometime. Will discontinue active navigation at this time, but be available to the patient as needed in the future.   Oncology Nurse Navigator Documentation     11/23/2023   10:30 AM  Oncology Nurse Navigator Flowsheets  Navigation Complete Date: 11/23/2023  Post Navigation: Continue to Follow Patient? No  Reason Not Navigating Patient: Patient On Maintenance Chemotherapy  Navigator Location [facility]  Navigator Encounter Type Follow-up Appt  Patient Visit Type MedOnc  Treatment Phase Active Tx  Barriers/Navigation Needs No Barriers At This Time  Interventions None Required  Acuity Level 2-Minimal Needs (1-2 Barriers Identified)  Support Groups/Services Friends and Family  Time Spent with Patient 15

## 2023-11-23 NOTE — Progress Notes (Signed)
 Hematology and Oncology Follow Up Visit  Nicole Guzman [id number] [birth year] 83 y.o. 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 daily     Interim History:  Nicole Guzman is back for follow-up.  Nicole Guzman is doing quite nicely.  Nicole Guzman is now on Aromasin.  Nicole Guzman has had no problems with the Aromasin.  There is no arthralgias or myalgias.  Nicole Guzman has had no fever.  There is no problems with bleeding.  Nicole Guzman is on Xarelto for chronic atrial fibrillation.  Nicole Guzman has had no change in bowel bladder habits.  There has been no leg swelling.  Nicole Guzman has had no nausea or vomiting.  Nicole Guzman has had no problems with cough or shortness of breath.  Nicole Guzman has had no issues with COVID.  Overall, I would say that her performance status is probably ECOG 1.   Medications:  Current Outpatient Medications:    Calcium Carb-Cholecalciferol (CALCIUM 500/D) 500-400 MG-UNIT CHEW, Chew by mouth., Disp: , Rfl:    COMBIGAN 0.2-0.5 % ophthalmic solution, Apply 1 drop to eye 2 (two) times daily., Disp: , Rfl:    denosumab (PROLIA) 60 MG/ML SOSY injection, Inject 60 mg into the skin every 6 (six) months., Disp: 1 mL, Rfl: 1   dorzolamide (TRUSOPT) 2 % ophthalmic solution, Place 1 drop into both eyes 2 (two) times daily., Disp: , Rfl:    exemestane (AROMASIN) 25 MG tablet, Take 1 tablet (25 mg total) by mouth daily after breakfast., Disp: 30 tablet, Rfl: 12   metoprolol tartrate (LOPRESSOR) 25 MG tablet, Take 1 tablet by mouth twice daily, Disp: 180 tablet, Rfl: 3   Multiple  Vitamins-Minerals (MULTIVITAMIN WITH MINERALS) tablet, Take 1 tablet by mouth daily., Disp: , Rfl:    Travoprost, BAK Free, (TRAVATAN) 0.004 % SOLN ophthalmic solution, INSTILL 1 DROP INTO EACH EYE EVERY DAY AT BEDTIME, Disp: , Rfl:    Vitamin D, Cholecalciferol, 25 MCG (1000 UT) TABS, Take by mouth., Disp: , Rfl:    XARELTO 20 MG TABS tablet, Take 1 tablet by mouth once daily, Disp: 30 tablet, Rfl: 11  Current Facility-Administered Medications:    [START ON 04/11/2024] denosumab (PROLIA) injection 60 mg, 60 mg, Subcutaneous, Once, Rudd, Bertram Millard, MD  Allergies: No Known Allergies  Past Medical History, Surgical history, Social history, and Family History were reviewed and updated.  Review of Systems: Review of Systems  Constitutional: Negative.   HENT:  Negative.    Eyes: Negative.   Respiratory: Negative.    Cardiovascular: Negative.   Gastrointestinal: Negative.   Endocrine: Negative.   Genitourinary: Negative.    Musculoskeletal: Negative.   Skin: Negative.   Neurological: Negative.   Hematological: Negative.   Psychiatric/Behavioral: Negative.      Physical Exam:  height is 5\' 4"  (1.626 m) and weight is 167 lb (75.8 kg). Her oral temperature is 98 F (36.7 C). Her blood pressure is 142/83 (abnormal) and her pulse is 77. Her respiration is 18 and oxygen saturation is 95%.   Wt Readings from Last 3 Encounters:  11/23/23 167 lb (75.8 kg)  10/05/23  167 lb (75.8 kg)  08/23/23 166 lb (75.3 kg)    Physical Exam Vitals reviewed.  Constitutional:      Comments: Right breast shows the healing lumpectomy scar at about the 10 o'clock position.  Nicole Guzman has no erythema.  There is no swelling.  There is no nipple discharge.  There is no right axillary adenopathy. .   Left breast shows changes from lumpectomy and radiation.  Nicole Guzman has the lumpectomy scar at about the 8 o'clock position.  This is well-healed.  There is no left axillary adenopathy.  HENT:     Head: Normocephalic and  atraumatic.  Eyes:     Pupils: Pupils are equal, round, and reactive to light.  Cardiovascular:     Rate and Rhythm: Normal rate and regular rhythm.     Heart sounds: Normal heart sounds.  Pulmonary:     Effort: Pulmonary effort is normal.     Breath sounds: Normal breath sounds.  Abdominal:     General: Bowel sounds are normal.     Palpations: Abdomen is soft.  Musculoskeletal:        General: No tenderness or deformity. Normal range of motion.     Cervical back: Normal range of motion.  Lymphadenopathy:     Cervical: No cervical adenopathy.  Skin:    General: Skin is warm and dry.     Findings: No erythema or rash.  Neurological:     Mental Status: Nicole Guzman is alert and oriented to person, place, and time.  Psychiatric:        Behavior: Behavior normal.        Thought Content: Thought content normal.        Judgment: Judgment normal.      Lab Results  Component Value Date   WBC 6.1 11/23/2023   HGB 14.2 11/23/2023   HCT 43.9 11/23/2023   MCV 99.1 11/23/2023   PLT 266 11/23/2023     Chemistry      Component Value Date/Time   NA 142 10/05/2023 1006   K 4.5 10/05/2023 1006   CL 103 10/05/2023 1006   CO2 33 (H) 10/05/2023 1006   BUN 10 10/05/2023 1006   CREATININE 0.73 10/05/2023 1006      Component Value Date/Time   CALCIUM 10.3 10/05/2023 1006   ALKPHOS 53 10/05/2023 1006   AST 19 10/05/2023 1006   ALT 13 10/05/2023 1006   BILITOT 0.7 10/05/2023 1006      Impression and Plan: Nicole Guzman is a very nice 83 year old postmenopausal white female.  Nicole Guzman has had history of stage IIa infiltrating lobular carcinoma of the left breast.  Nicole Guzman had treatment for this.  This was about 11 years ago with radiation and surgery.  Nicole Guzman was on Arimidex.  We stopped the Arimidex in October.  Nicole Guzman now has a new breast cancer.  This is a stage I ductal carcinoma the right breast.  Nicole Guzman underwent radiotherapy for this.  Nicole Guzman is now on Aromasin.  Patient.  We will now plan to get back  in a couple months.  We will get her through [facility] and see her back afterwards.      Josph Macho, MD 2/25/202510:36 AM

## 2023-12-26 ENCOUNTER — Other Ambulatory Visit: Payer: Self-pay | Admitting: Family Medicine

## 2023-12-26 DIAGNOSIS — I4811 Longstanding persistent atrial fibrillation: Secondary | ICD-10-CM

## 2023-12-27 NOTE — Telephone Encounter (Signed)
 [id number] placed for pt for [facility].  Called to inform pt of the referral, the reason, and to expect a phone call form their office. Pt understands and thanked me for letting her know. Advised to reach out to [place] with questions or concerns.

## 2023-12-27 NOTE — Addendum Note (Signed)
 Addended by: Larey Dresser on: 12/27/2023 10:39 AM   Modules accepted: Orders

## 2024-01-17 ENCOUNTER — Ambulatory Visit: Admitting: Physician Assistant

## 2024-01-17 ENCOUNTER — Encounter: Payer: Self-pay | Admitting: Physician Assistant

## 2024-01-17 DIAGNOSIS — M818 Other osteoporosis without current pathological fracture: Secondary | ICD-10-CM | POA: Diagnosis not present

## 2024-01-17 DIAGNOSIS — T386X5A Adverse effect of antigonadotrophins, antiestrogens, antiandrogens, not elsewhere classified, initial encounter: Secondary | ICD-10-CM

## 2024-01-17 NOTE — Progress Notes (Signed)
 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  She recently been had radiation treatment for right breast carcinoma in 2020 for left in 2012.  She was recently started on Prolia  in January by her primary care and has another injection due in July.  She is osteopenic has never had fractures of her hip wrist or spine.  No history of stroke.  Does not have kidney disease any ulcers or reflux.  Does not have a history of epilepsy.  She did have a hysterectomy at 47.  Did not have any hormone replacement.  She takes calcium and vitamin D  on a regular basement.  She is a former smoker but none currently she does not drink.  She does walk for exercise.  I do think Prolia  is the preferred medication for her.  Did talk to her about getting involved in a Programme researcher, broadcasting/film/video program or through [employer] and recommendations in her community for some senior strength training.  She is going to look into this.  Should continue to get Prolia  she understands this is an ongoing medication for her now.  May follow-up with me if she has any questions will continue to obtain Prolia  through her primary care 30 minutes was spent reviewing her chart and speaking with the patient  Follow-Up Instructions: No follow-ups on file.   Orders:  No orders of the defined types were placed in this encounter.  No orders of the defined types were placed in this encounter.     Procedures: No procedures performed   Clinical Data: No additional findings.   Subjective: No chief complaint on file.   HPI patient is a pleasant 83 year old woman referred  from primary care for evaluation of osteoporosis.  She currently did start Prolia  therapy through them.  She said that she get an injection in January and is scheduled to get another 1 in July has tolerated the medication well  Review of Systems  All other systems reviewed and are negative.    Objective: Vital Signs: There were no vitals taken for this visit.  Physical Exam Constitutional:      Appearance: Normal appearance.  Pulmonary:     Effort: Pulmonary effort is normal.  Skin:    General: Skin is warm and dry.  Neurological:     General: No focal deficit present.     Mental Status: She is alert and oriented to person, place, and time.  Psychiatric:        Mood and Affect: Mood normal.        Behavior: Behavior normal.     Ortho Exam  Specialty Comments:  No specialty comments available.  Imaging: No results found.   PMFS History: Patient Active Problem List   Diagnosis Date Noted   Status post right breast lumpectomy 08/23/2023   Age-related osteoporosis without fracture 08/23/2023   Chronic anticoagulation 06/28/2023   Malignant neoplasm of upper-outer quadrant of right breast in female, estrogen receptor positive (HCC) 06/28/2023  Combined forms of age-related cataract of left eye 06/21/2023   Combined forms of age-related cataract of right eye 06/21/2023   Primary open angle glaucoma (POAG) of right eye, severe stage 04/26/2023   Other chest pain 11/25/2022   Stage II breast cancer, left (HCC) 01/16/2021   Borderline hyperlipidemia 12/26/2020   Vitamin D  deficiency 12/26/2020   Allergic rhinitis 12/26/2020   Diverticulosis 12/26/2020   History of breast cancer 12/11/2020   Glaucoma 12/11/2020   Atrial fibrillation (HCC) 12/11/2020   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 12/11/2020   Past Medical History:  Diagnosis Date   Allergy    Cancer (HCC) 2012   Breast   Goals of care, counseling/discussion 01/16/2021   Osteoporosis    Stage II breast  cancer, left (HCC) 01/16/2021    Family History  Problem Relation Age of Onset   Hyperlipidemia Mother    Alzheimer's disease Mother    Stroke Father    Heart disease Father    Alzheimer's disease Maternal Grandmother     Past Surgical History:  Procedure Laterality Date   ABDOMINAL HYSTERECTOMY     age 73   BREAST BIOPSY Right 06/03/2023   US  RT BREAST BX W LOC DEV 1ST LESION IMG BX SPEC US  GUIDE 06/03/2023 [facility]   EYE SURGERY     Social History   Occupational History   Occupation: retired  Tobacco Use   Smoking status: Former    Types: Cigarettes   Smokeless tobacco: Never   Tobacco comments:    quit 54 years ago  Vaping Use   Vaping status: Never Used  Substance and Sexual Activity   Alcohol use: Not Currently   Drug use: Never   Sexual activity: Yes

## 2024-01-24 ENCOUNTER — Inpatient Hospital Stay: Payer: Medicare PPO

## 2024-01-24 ENCOUNTER — Ambulatory Visit: Payer: Medicare PPO | Admitting: Hematology & Oncology

## 2024-01-24 DIAGNOSIS — H25812 Combined forms of age-related cataract, left eye: Secondary | ICD-10-CM | POA: Diagnosis not present

## 2024-01-24 DIAGNOSIS — H401113 Primary open-angle glaucoma, right eye, severe stage: Secondary | ICD-10-CM | POA: Diagnosis not present

## 2024-01-24 DIAGNOSIS — H25813 Combined forms of age-related cataract, bilateral: Secondary | ICD-10-CM | POA: Diagnosis not present

## 2024-01-24 DIAGNOSIS — H25811 Combined forms of age-related cataract, right eye: Secondary | ICD-10-CM | POA: Diagnosis not present

## 2024-01-24 DIAGNOSIS — H401122 Primary open-angle glaucoma, left eye, moderate stage: Secondary | ICD-10-CM | POA: Diagnosis not present

## 2024-01-26 ENCOUNTER — Inpatient Hospital Stay: Payer: Medicare PPO | Admitting: Hematology & Oncology

## 2024-01-26 ENCOUNTER — Inpatient Hospital Stay: Payer: Medicare PPO | Attending: Hematology & Oncology

## 2024-01-26 ENCOUNTER — Encounter: Payer: Self-pay | Admitting: Hematology & Oncology

## 2024-01-26 VITALS — BP 150/77 | HR 68 | Temp 98.8°F | Resp 18 | Ht 65.0 in | Wt 167.5 lb

## 2024-01-26 DIAGNOSIS — I4891 Unspecified atrial fibrillation: Secondary | ICD-10-CM | POA: Insufficient documentation

## 2024-01-26 DIAGNOSIS — C50912 Malignant neoplasm of unspecified site of left female breast: Secondary | ICD-10-CM | POA: Insufficient documentation

## 2024-01-26 DIAGNOSIS — Z1721 Progesterone receptor positive status: Secondary | ICD-10-CM | POA: Diagnosis not present

## 2024-01-26 DIAGNOSIS — C50911 Malignant neoplasm of unspecified site of right female breast: Secondary | ICD-10-CM | POA: Diagnosis not present

## 2024-01-26 DIAGNOSIS — Z1732 Human epidermal growth factor receptor 2 negative status: Secondary | ICD-10-CM | POA: Diagnosis not present

## 2024-01-26 DIAGNOSIS — Z7901 Long term (current) use of anticoagulants: Secondary | ICD-10-CM | POA: Insufficient documentation

## 2024-01-26 DIAGNOSIS — Z79811 Long term (current) use of aromatase inhibitors: Secondary | ICD-10-CM | POA: Diagnosis not present

## 2024-01-26 DIAGNOSIS — Z170 Estrogen receptor positive status [ER+]: Secondary | ICD-10-CM | POA: Insufficient documentation

## 2024-01-26 LAB — CBC WITH DIFFERENTIAL (CANCER CENTER ONLY)
Abs Immature Granulocytes: 0.01 10*3/uL (ref 0.00–0.07)
Basophils Absolute: 0 10*3/uL (ref 0.0–0.1)
Basophils Relative: 0 %
Eosinophils Absolute: 0.1 10*3/uL (ref 0.0–0.5)
Eosinophils Relative: 1 %
HCT: 43 % (ref 36.0–46.0)
Hemoglobin: 13.8 g/dL (ref 12.0–15.0)
Immature Granulocytes: 0 %
Lymphocytes Relative: 25 %
Lymphs Abs: 1.7 10*3/uL (ref 0.7–4.0)
MCH: 31.6 pg (ref 26.0–34.0)
MCHC: 32.1 g/dL (ref 30.0–36.0)
MCV: 98.4 fL (ref 80.0–100.0)
Monocytes Absolute: 0.6 10*3/uL (ref 0.1–1.0)
Monocytes Relative: 9 %
Neutro Abs: 4.4 10*3/uL (ref 1.7–7.7)
Neutrophils Relative %: 65 %
Platelet Count: 260 10*3/uL (ref 150–400)
RBC: 4.37 MIL/uL (ref 3.87–5.11)
RDW: 14.3 % (ref 11.5–15.5)
WBC Count: 6.7 10*3/uL (ref 4.0–10.5)
nRBC: 0 % (ref 0.0–0.2)

## 2024-01-26 LAB — CMP (CANCER CENTER ONLY)
ALT: 10 U/L (ref 0–44)
AST: 16 U/L (ref 15–41)
Albumin: 4.2 g/dL (ref 3.5–5.0)
Alkaline Phosphatase: 48 U/L (ref 38–126)
Anion gap: 5 (ref 5–15)
BUN: 12 mg/dL (ref 8–23)
CO2: 33 mmol/L — ABNORMAL HIGH (ref 22–32)
Calcium: 9.8 mg/dL (ref 8.9–10.3)
Chloride: 103 mmol/L (ref 98–111)
Creatinine: 0.76 mg/dL (ref 0.44–1.00)
GFR, Estimated: >60 mL/min (ref >60)
Glucose, Bld: 84 mg/dL (ref 70–99)
Potassium: 4.4 mmol/L (ref 3.5–5.1)
Sodium: 141 mmol/L (ref 135–145)
Total Bilirubin: 0.7 mg/dL (ref 0.0–1.2)
Total Protein: 7.2 g/dL (ref 6.5–8.1)

## 2024-01-26 NOTE — Progress Notes (Signed)
 Hematology and Oncology Follow Up Visit  Nicole Guzman [id number] 02-22-1941 83 y.o. 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 daily     Interim History:  Nicole Guzman is back for follow-up.  So far, everything is going pretty well with her.  She really has had no complaints since we last saw her back in February.  She is on Xarelto  because of the atrial fibrillation.  She has had no problems with bleeding.  There is no change in bowel or bladder habits.  She has had no cough or shortness of breath.  She is on Aromasin .  She is doing well with the Aromasin .  She denies any kind of arthralgias.  She has had no problems with fever.  She has had no rashes.  There is been no leg swelling.  She has had no headache.  Overall, I would say that her performance status is probably ECOG 1.  Medications:  Current Outpatient Medications:    Calcium Carb-Cholecalciferol (CALCIUM 500/D) 500-400 MG-UNIT CHEW, Chew by mouth., Disp: , Rfl:    COMBIGAN 0.2-0.5 % ophthalmic solution, Apply 1 drop to eye 2 (two) times daily., Disp: , Rfl:    denosumab  (PROLIA ) 60 MG/ML SOSY injection, Inject 60 mg into the skin every 6 (six) months., Disp: 1 mL, Rfl: 1   dorzolamide (TRUSOPT) 2 % ophthalmic solution, Place 1 drop into both eyes 2 (two) times daily., Disp: , Rfl:    exemestane  (AROMASIN ) 25 MG tablet, Take 1 tablet (25 mg total) by mouth daily after breakfast., Disp: 30 tablet, Rfl: 12   metoprolol  tartrate (LOPRESSOR ) 25 MG tablet, Take 1 tablet  by mouth twice daily, Disp: 180 tablet, Rfl: 3   Multiple Vitamins-Minerals (MULTIVITAMIN WITH MINERALS) tablet, Take 1 tablet by mouth daily., Disp: , Rfl:    Travoprost, BAK Free, (TRAVATAN) 0.004 % SOLN ophthalmic solution, INSTILL 1 DROP INTO EACH EYE EVERY DAY AT BEDTIME, Disp: , Rfl:    Vitamin D , Cholecalciferol, 25 MCG (1000 UT) TABS, Take by mouth., Disp: , Rfl:    XARELTO  20 MG TABS tablet, Take 1 tablet by mouth once daily, Disp: 30 tablet, Rfl: 11  Current Facility-Administered Medications:    [START ON 04/11/2024] denosumab  (PROLIA ) injection 60 mg, 60 mg, Subcutaneous, Once, Rudd, Malcolm Scrivener, MD  Allergies: No Known Allergies  Past Medical History, Surgical history, Social history, and Family History were reviewed and updated.  Review of Systems: Review of Systems  Constitutional: Negative.   HENT:  Negative.    Eyes: Negative.   Respiratory: Negative.    Cardiovascular: Negative.   Gastrointestinal: Negative.   Endocrine: Negative.   Genitourinary: Negative.    Musculoskeletal: Negative.   Skin: Negative.   Neurological: Negative.   Hematological: Negative.   Psychiatric/Behavioral: Negative.      Physical Exam:  height is 5\' 5"  (1.651 m) and weight is 167 lb 8 oz (76 kg). Her oral temperature is 98.8 F (37.1 C). Her blood pressure is 150/77 (abnormal) and her pulse is 68. Her respiration is 18 and oxygen saturation is 95%.  Wt Readings from Last 3 Encounters:  01/26/24 167 lb 8 oz (76 kg)  11/23/23 167 lb (75.8 kg)  10/05/23 167 lb (75.8 kg)    Physical Exam Vitals reviewed.  Constitutional:      Comments: Right breast shows the healing lumpectomy scar at about the 10 o'clock position.  She has no erythema.  There is no swelling.  There is no nipple discharge.  There is no right axillary adenopathy. .   Left breast shows changes from lumpectomy and radiation.  She has the lumpectomy scar at about the 8 o'clock position.  This is well-healed.  There is no  left axillary adenopathy.  HENT:     Head: Normocephalic and atraumatic.  Eyes:     Pupils: Pupils are equal, round, and reactive to light.  Cardiovascular:     Rate and Rhythm: Normal rate and regular rhythm.     Heart sounds: Normal heart sounds.  Pulmonary:     Effort: Pulmonary effort is normal.     Breath sounds: Normal breath sounds.  Abdominal:     General: Bowel sounds are normal.     Palpations: Abdomen is soft.  Musculoskeletal:        General: No tenderness or deformity. Normal range of motion.     Cervical back: Normal range of motion.  Lymphadenopathy:     Cervical: No cervical adenopathy.  Skin:    General: Skin is warm and dry.     Findings: No erythema or rash.  Neurological:     Mental Status: She is alert and oriented to person, place, and time.  Psychiatric:        Behavior: Behavior normal.        Thought Content: Thought content normal.        Judgment: Judgment normal.      Lab Results  Component Value Date   WBC 6.7 01/26/2024   HGB 13.8 01/26/2024   HCT 43.0 01/26/2024   MCV 98.4 01/26/2024   PLT 260 01/26/2024     Chemistry      Component Value Date/Time   NA 141 01/26/2024 1008   K 4.4 01/26/2024 1008   CL 103 01/26/2024 1008   CO2 33 (H) 01/26/2024 1008   BUN 12 01/26/2024 1008   CREATININE 0.76 01/26/2024 1008      Component Value Date/Time   CALCIUM 9.8 01/26/2024 1008   ALKPHOS 48 01/26/2024 1008   AST 16 01/26/2024 1008   ALT 10 01/26/2024 1008   BILITOT 0.7 01/26/2024 1008      Impression and Plan: Nicole Guzman is a very nice 83 year old postmenopausal white female.  She has had history of stage IIa infiltrating lobular carcinoma of the left breast.  She had treatment for this.  This was about 11 years ago with radiation and surgery.  She was on Arimidex .  We stopped the Arimidex  in October.  She now has a new breast cancer.  This is a stage I ductal carcinoma the right breast.  She underwent radiotherapy for this.  She  is now on Aromasin .    At this point, we can probably move her appointments out little bit longer.  Will plan to get her back in about 3 months.  I am sure that she will have a wonderful birthday in [birth year].       Ivor Mars, MD 4/30/202511:31 AM

## 2024-02-22 ENCOUNTER — Encounter: Payer: Self-pay | Admitting: Internal Medicine

## 2024-02-22 ENCOUNTER — Ambulatory Visit: Admitting: Internal Medicine

## 2024-02-22 ENCOUNTER — Ambulatory Visit: Payer: Self-pay

## 2024-02-22 VITALS — BP 138/84 | HR 73 | Temp 97.8°F | Ht 65.0 in | Wt 168.8 lb

## 2024-02-22 DIAGNOSIS — L509 Urticaria, unspecified: Secondary | ICD-10-CM | POA: Diagnosis not present

## 2024-02-22 MED ORDER — METHYLPREDNISOLONE SODIUM SUCC 125 MG IJ SOLR
60.0000 mg | Freq: Once | INTRAMUSCULAR | Status: AC
Start: 2024-02-22 — End: 2024-02-22
  Administered 2024-02-22: 60 mg via INTRAMUSCULAR

## 2024-02-22 NOTE — Telephone Encounter (Signed)
 Copied from CRM [id number]. Topic: Clinical - Red Word Triage >> Feb 22, 2024 12:16 PM Caliyah H wrote: Red Word that prompted transfer to Nurse Triage: Patient called to report itching that began around 1:30 am today. She stated that the itching has worsened and is now serve. She has also broken out in hives, primarily on her back and torso.    Chief Complaint: Hives,itching Symptoms: above Frequency: last night Pertinent Negatives: Patient denies  Disposition: [] ED /[] Urgent Care (no appt availability in office) / [x] Appointment(In office/virtual)/ []  [facility]/ [] Home Care/ [] Refused Recommended Disposition /[] [facility]/ []  Follow-up with PCP Additional Notes: agree with appointment.  Reason for Disposition  [1] MODERATE-SEVERE hives persist (i.e., hives interfere with normal activities or work) AND [2] taking antihistamine (e.g., Benadryl, Claritin) > 24 hours  Answer Assessment - Initial Assessment Questions 1. APPEARANCE: "What does the rash look like?"      Hives 2. LOCATION: "Where is the rash located?"      trunk 3. NUMBER: "How many hives are there?"      many 4. SIZE: "How big are the hives?" (inches, cm, compare to coins) "Do they all look the same or is there lots of variation in shape and size?"      quarter 5. ONSET: "When did the hives begin?" (Hours or days ago)      Last night 6. ITCHING: "Does it itch?" If Yes, ask: "How bad is the itch?"    - MILD: doesn't interfere with normal activities   - MODERATE-SEVERE: interferes with work, school, sleep, or other activities      Moderate  7. RECURRENT PROBLEM: "Have you had hives before?" If Yes, ask: "When was the last time?" and "What happened that time?"      no 8. TRIGGERS: "Were you exposed to any new food, plant, cosmetic product or animal just before the hives began?"     no 9. OTHER SYMPTOMS: "Do you have any other symptoms?" (e.g., fever, tongue swelling, difficulty breathing, abdomen  pain)     no 10. PREGNANCY: "Is there any chance you are pregnant?" "When was your last menstrual period?"       no  Protocols used: Hives-A-AH

## 2024-02-22 NOTE — Telephone Encounter (Signed)
 Patient seen in office

## 2024-02-22 NOTE — Progress Notes (Signed)
 [facility] [address] [place] [address] Dept: [phone] Dept Fax: [phone]  Acute Care Office Visit  Subjective:   Yoko Mcgahee Sep 24, 1941 02/22/2024  Chief Complaint  Patient presents with   Urticaria    Stated this morning     HPI: Discussed the use of AI scribe software for clinical note transcription with the patient, who gave verbal consent to proceed.  History of Present Illness   Sundra Haddix is an 83 year old female who presents with a new onset rash and itching.  She experienced itching that began at 1:30 AM without any visible rash or hives. A warm shower provided minimal relief. By 6:00 AM, the itching intensified, and by noon, a rash developed.  The rash is spreading and was not present when she initially called to make the appointment. She denies any recent changes in lotions, detergents, clothing, or medications that could have triggered the reaction. She wore a different nightgown the previous night, but it was not new and had been worn before without issue.  She has not taken any medication for the itching or rash prior to the visit.  She has glaucoma  and atrial fibrillation.   No new lotions, detergents, perfumes, or medications. No recent changes in diet or environmental exposures. No SHOB, wheezing, CP, palpitations.       The following portions of the patient's history were reviewed and updated as appropriate: past medical history, past surgical history, family history, social history, allergies, medications, and problem list.   Patient Active Problem List   Diagnosis Date Noted   Status post right breast lumpectomy 08/23/2023   Age-related osteoporosis without fracture 08/23/2023   Chronic anticoagulation 06/28/2023   Malignant neoplasm of upper-outer quadrant of right breast in female, estrogen receptor positive (HCC) 06/28/2023   Combined forms of age-related cataract of  left eye 06/21/2023   Combined forms of age-related cataract of right eye 06/21/2023   Primary open angle glaucoma (POAG) of right eye, severe stage 04/26/2023   Other chest pain 11/25/2022   Stage II breast cancer, left (HCC) 01/16/2021   Borderline hyperlipidemia 12/26/2020   Vitamin D  deficiency 12/26/2020   Allergic rhinitis 12/26/2020   Diverticulosis 12/26/2020   History of breast cancer 12/11/2020   Glaucoma 12/11/2020   Atrial fibrillation (HCC) 12/11/2020   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 12/11/2020   Past Medical History:  Diagnosis Date   Allergy    Cancer (HCC) 2012   Breast   Goals of care, counseling/discussion 01/16/2021   Osteoporosis    Stage II breast cancer, left (HCC) 01/16/2021   Past Surgical History:  Procedure Laterality Date   ABDOMINAL HYSTERECTOMY     age 89   BREAST BIOPSY Right 06/03/2023   US  RT BREAST BX W LOC DEV 1ST LESION IMG BX SPEC US  GUIDE 06/03/2023 [facility]   EYE SURGERY     Family History  Problem Relation Age of Onset   Hyperlipidemia Mother    Alzheimer's disease Mother    Stroke Father    Heart disease Father    Alzheimer's disease Maternal Grandmother     Current Outpatient Medications:    Calcium Carb-Cholecalciferol (CALCIUM 500/D) 500-400 MG-UNIT CHEW, Chew by mouth., Disp: , Rfl:    COMBIGAN 0.2-0.5 % ophthalmic solution, Apply 1 drop to eye 2 (two) times daily., Disp: , Rfl:    denosumab  (PROLIA ) 60 MG/ML SOSY injection, Inject 60 mg into the skin every 6 (six) months., Disp: 1  mL, Rfl: 1   exemestane  (AROMASIN ) 25 MG tablet, Take 1 tablet (25 mg total) by mouth daily after breakfast., Disp: 30 tablet, Rfl: 12   metoprolol  tartrate (LOPRESSOR ) 25 MG tablet, Take 1 tablet by mouth twice daily, Disp: 180 tablet, Rfl: 3   Multiple Vitamins-Minerals (MULTIVITAMIN WITH MINERALS) tablet, Take 1 tablet by mouth daily., Disp: , Rfl:    Travoprost, BAK Free, (TRAVATAN) 0.004 % SOLN ophthalmic solution, INSTILL 1 DROP  INTO EACH EYE EVERY DAY AT BEDTIME, Disp: , Rfl:    Vitamin D , Cholecalciferol, 25 MCG (1000 UT) TABS, Take by mouth., Disp: , Rfl:    XARELTO  20 MG TABS tablet, Take 1 tablet by mouth once daily, Disp: 30 tablet, Rfl: 11   dorzolamide (TRUSOPT) 2 % ophthalmic solution, Place 1 drop into both eyes 2 (two) times daily., Disp: , Rfl:   Current Facility-Administered Medications:    [START ON 04/11/2024] denosumab  (PROLIA ) injection 60 mg, 60 mg, Subcutaneous, Once, Rudd, Malcolm Scrivener, MD   methylPREDNISolone  sodium succinate (SOLU-MEDROL ) 125 mg/2 mL injection 60 mg, 60 mg, Intramuscular, Once,  No Known Allergies   ROS: A complete ROS was performed with pertinent positives/negatives noted in the HPI. The remainder of the ROS are negative.    Objective:   Today's Vitals   02/22/24 1310  BP: 138/84  Pulse: 73  Temp: 97.8 F (36.6 C)  TempSrc: Temporal  SpO2: 98%  Weight: 168 lb 12.8 oz (76.6 kg)  Height: 5\' 5"  (1.651 m)    GENERAL: Well-appearing, in NAD. Well nourished.  SKIN: Pink, warm and dry. Urticarial rash to chest , back, upper extremities, and abdomen NECK: Trachea midline. Full ROM w/o pain or tenderness. No lymphadenopathy.  RESPIRATORY: Chest wall symmetrical. Respirations even and non-labored.  CARDIAC: Peripheral pulses 2+ bilaterally.  EXTREMITIES: Without clubbing, cyanosis, or edema.  NEUROLOGIC:  Steady, even gait.  PSYCH/MENTAL STATUS: Alert, oriented x 3. Cooperative, appropriate mood and affect.    No results found for any visits on 02/22/24.    Assessment & Plan:  Assessment and Plan    Hives of Unknown Origin  - Administer Solu-Medrol  60mg  injection to reduce rash and pruritus. - Advise diphenhydramine 25 mg every six hours as needed for pruritus, caution regarding drowsiness. - Recommend over-the-counter hydrocortisone cream 1% to affected areas as needed for pruritus. - Suggest oatmeal baths to alleviate pruritus. - Instruct to wash nightgown and bed  sheets to eliminate potential allergens. - Advise to return if symptoms worsen or do not improve.     - Will hold off on prednisone  taper pack at this time due to hx of Atrial fib and glaucoma   Meds ordered this encounter  Medications   methylPREDNISolone  sodium succinate (SOLU-MEDROL ) 125 mg/2 mL injection 60 mg   No orders of the defined types were placed in this encounter.  Lab Orders  No laboratory test(s) ordered today   No images are attached to the encounter or orders placed in the encounter.  Return if symptoms worsen or fail to improve.   Gavin Kast, FNP

## 2024-02-22 NOTE — Patient Instructions (Addendum)
 Over the counter:  Hydrocortisone cream 1% - apply to affected area as needed for itching   2.   Benadryl 25mg  - take 1 tablet by mouth every 6 hours as needed for rash and itching. May cause drowsiness.

## 2024-02-23 ENCOUNTER — Other Ambulatory Visit: Payer: Self-pay | Admitting: Internal Medicine

## 2024-02-23 ENCOUNTER — Ambulatory Visit: Payer: Self-pay

## 2024-02-23 DIAGNOSIS — L509 Urticaria, unspecified: Secondary | ICD-10-CM

## 2024-02-23 MED ORDER — PREDNISONE 20 MG PO TABS
40.0000 mg | ORAL_TABLET | Freq: Every day | ORAL | 0 refills | Status: AC
Start: 2024-02-23 — End: 2024-02-28

## 2024-02-23 NOTE — Telephone Encounter (Signed)
 I have gone ahead and sent in the prednisone  to see if this helps.

## 2024-02-23 NOTE — Telephone Encounter (Signed)
 Chief Complaint: rash Symptoms: rash and pain Frequency: x 1 day Pertinent Negatives: Patient denies sob/fever Disposition: [] ED /[] Urgent Care (no appt availability in office) / [x] Appointment(In office/virtual)/ []  [facility]/ [] Home Care/ [] Refused Recommended Disposition /[] [place] [facility]/ []  Follow-up with PCP  Additional Notes: pt states that the rash is a little better and she is taking the benadryl but at the 5 hour mark she starts to itching ago. Pt states that the rash has not spread to any new areas. States that the itching and hives comes back. Pt is wondering about starting on the prednisone that was mentioned yesterday. Pt states that she was up most of the night due to the itching.   Copied from CRM [id number]. Topic: Clinical - Red Word Triage >> Feb 23, 2024  2:06 PM [employer] wrote: Red Word that prompted transfer to Nurse Triage: Painful rash Reason for Disposition . SEVERE itching (i.e., interferes with sleep, normal activities or school)  Protocols used: Rash or Redness - [facility]

## 2024-02-23 NOTE — Telephone Encounter (Signed)
 Patient informed will pick up Rx and I will give her tomorrow

## 2024-02-24 ENCOUNTER — Ambulatory Visit: Admitting: Internal Medicine

## 2024-02-24 NOTE — Telephone Encounter (Signed)
 Patient was contacted this morning to follow up on her symptoms and stated she would Rx a chance to work and if symptoms continue she will give the office a call

## 2024-02-28 ENCOUNTER — Ambulatory Visit (INDEPENDENT_AMBULATORY_CARE_PROVIDER_SITE_OTHER): Payer: Medicare PPO

## 2024-02-28 DIAGNOSIS — Z0000 Encounter for general adult medical examination without abnormal findings: Secondary | ICD-10-CM

## 2024-02-28 NOTE — Progress Notes (Signed)
 Subjective:   Nicole Guzman is a 83 y.o. who presents for a Medicare Wellness preventive visit.  As a reminder, Annual Wellness Visits don't include a physical exam, and some assessments may be limited, especially if this visit is performed virtually. We may recommend an in-person follow-up visit with your provider if needed.  Visit Complete: Virtual I connected with  Randa Burton on 02/28/24 by a audio enabled telemedicine application and verified that I am speaking with the correct person using two identifiers.  Patient Location: Home  Provider Location: Office/Clinic  I discussed the limitations of evaluation and management by telemedicine. The patient expressed understanding and agreed to proceed.  Vital Signs: Because this visit was a virtual/telehealth visit, some criteria may be missing or patient reported. Any vitals not documented were not able to be obtained and vitals that have been documented are patient reported.  VideoError- Librarian, academic were attempted between this provider and patient, however failed, due to patient having technical difficulties OR patient did not have access to video capability.  We continued and completed visit with audio only.   Persons Participating in Visit: Patient.  AWV Questionnaire: Yes: Patient Medicare AWV questionnaire was completed by the patient on 02/21/2024; I have confirmed that all information answered by patient is correct and no changes since this date.  Cardiac Risk Factors include: advanced age (>55men, >7 women);dyslipidemia     Objective:     Today's Vitals   There is no height or weight on file to calculate BMI.     02/28/2024    3:42 PM 01/26/2024   10:38 AM 11/23/2023   10:22 AM 10/05/2023   10:17 AM 08/18/2023   12:26 PM 03/30/2023   12:09 PM 02/26/2023    3:31 PM  Advanced Directives  Does Patient Have a Medical Advance Directive? Yes Yes Yes Yes Yes Yes Yes  Type of Sports coach of [place];Living will Healthcare Power of [place];Living will Living will;Healthcare Power of [employer] Power of [place];Living will Healthcare Power of [place];Living will Living will;Healthcare Power of [employer] Power of [place];Living will  Does patient want to make changes to medical advance directive?  No - Patient declined No - Patient declined No - Patient declined No - Patient declined No - Patient declined   Copy of Healthcare Power of Attorney in Chart? No - copy requested No - copy requested   No - copy requested  No - copy requested    Current Medications (verified) Outpatient Encounter Medications as of 02/28/2024  Medication Sig   Calcium Carb-Cholecalciferol (CALCIUM 500/D) 500-400 MG-UNIT CHEW Chew by mouth.   COMBIGAN 0.2-0.5 % ophthalmic solution Apply 1 drop to eye 2 (two) times daily.   denosumab  (PROLIA ) 60 MG/ML SOSY injection Inject 60 mg into the skin every 6 (six) months.   dorzolamide (TRUSOPT) 2 % ophthalmic solution Place 1 drop into both eyes 2 (two) times daily.   exemestane  (AROMASIN ) 25 MG tablet Take 1 tablet (25 mg total) by mouth daily after breakfast.   metoprolol  tartrate (LOPRESSOR ) 25 MG tablet Take 1 tablet by mouth twice daily   Multiple Vitamins-Minerals (MULTIVITAMIN WITH MINERALS) tablet Take 1 tablet by mouth daily.   Travoprost, BAK Free, (TRAVATAN) 0.004 % SOLN ophthalmic solution INSTILL 1 DROP INTO EACH EYE EVERY DAY AT BEDTIME   Vitamin D , Cholecalciferol, 25 MCG (1000 UT) TABS Take by mouth.   XARELTO  20 MG TABS tablet Take 1 tablet by mouth once daily  predniSONE  (DELTASONE ) 20 MG tablet Take 2 tablets (40 mg total) by mouth daily with breakfast for 5 days. (Patient not taking: Reported on 02/28/2024)   Facility-Administered Encounter Medications as of 02/28/2024  Medication   [START ON 04/11/2024] denosumab  (PROLIA ) injection 60 mg    Allergies (verified) Patient has no known allergies.    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   Past Surgical History:  Procedure Laterality Date   ABDOMINAL HYSTERECTOMY     age 61   BREAST BIOPSY Right 06/03/2023   US  RT BREAST BX W LOC DEV 1ST LESION IMG BX SPEC US  GUIDE 06/03/2023 [facility]   EYE SURGERY     Family History  Problem Relation Age of Onset   Hyperlipidemia Mother    Alzheimer's disease Mother    Stroke Father    Heart disease Father    Alzheimer's disease Maternal Grandmother    Social History   Socioeconomic History   Marital status: Widowed    Spouse name: Not on file   Number of children: Not on file   Years of education: Not on file   Highest education level: 12th grade  Occupational History   Occupation: retired  Tobacco Use   Smoking status: Former    Types: Cigarettes   Smokeless tobacco: Never   Tobacco comments:    quit 54 years ago  Vaping Use   Vaping status: Never Used  Substance and Sexual Activity   Alcohol use: Not Currently   Drug use: Never   Sexual activity: Yes  Other Topics Concern   Not on file  Social History Narrative   Not on file   Social Drivers of Health   Financial Resource Strain: Low Risk  (02/21/2024)   Overall Financial Resource Strain (CARDIA)    Difficulty of Paying Living Expenses: Not hard at all  Food Insecurity: No Food Insecurity (02/21/2024)   Hunger Vital Sign    Worried About Running Out of Food in the Last Year: Never true    Ran Out of Food in the Last Year: Never true  Transportation Needs: No Transportation Needs (02/21/2024)   PRAPARE - Administrator, Civil Service (Medical): No    Lack of Transportation (Non-Medical): No  Physical Activity: Insufficiently Active (02/21/2024)   Exercise Vital Sign    Days of Exercise per Week: 3 days    Minutes of Exercise per  Session: 20 min  Stress: No Stress Concern Present (02/21/2024)   [employer] of Occupational Health - Occupational Stress Questionnaire    Feeling of Stress : Not at all  Social Connections: Moderately Integrated (02/21/2024)   Social Connection and Isolation Panel [NHANES]    Frequency of Communication with Friends and Family: More than three times a week    Frequency of Social Gatherings with Friends and Family: Three times a week    Attends Religious Services: More than 4 times per year    Active Member of Clubs or Organizations: Yes    Attends Banker Meetings: More than 4 times per year    Marital Status: Widowed    Tobacco Counseling Counseling given: Not Answered Tobacco comments: quit 54 years ago    Clinical Intake:  Pre-visit preparation completed: Yes  Pain : No/denies pain     Nutritional Risks: None  Diabetes: No  No results found for: "HGBA1C"   How often do you need to have someone help you when you read instructions, pamphlets, or other written materials from your doctor or pharmacy?: 1 - Never  Interpreter Needed?: No  Information entered by :: NAllen LPN   Activities of Daily Living     02/21/2024    9:38 AM  In your present state of health, do you have any difficulty performing the following activities:  Hearing? 0  Vision? 0  Difficulty concentrating or making decisions? 0  Walking or climbing stairs? 0  Dressing or bathing? 0  Doing errands, shopping? 0  Preparing Food and eating ? N  Using the Toilet? N  In the past six months, have you accidently leaked urine? Y  Do you have problems with loss of bowel control? N  Managing your Medications? N  Managing your Finances? N  Housekeeping or managing your Housekeeping? N    Patient Care Team: Graig Lawyer, MD as PCP - General (Family Medicine) Maria Shiner Sherryll Donald, MD as Consulting Physician (Oncology) Illona Malone, Karlyn Overman, MD as Referring Physician (Ophthalmology)  I have  updated your Care Teams any recent Medical Services you may have received from other providers in the past year.     Assessment:    This is a routine wellness examination for [place].  Hearing/Vision screen Hearing Screening - Comments:: Denies hearing issues Vision Screening - Comments:: Regular eye exams, Dr. Rickford Charnley, [facility]   Goals Addressed             This Visit's Progress    Patient Stated       02/28/2024, start strength training       Depression Screen     02/28/2024    3:43 PM 02/22/2024    1:10 PM 07/02/2023   10:30 AM 02/26/2023    3:32 PM 02/17/2023    9:05 AM 02/17/2022    8:27 AM 02/17/2022    8:24 AM  PHQ 2/9 Scores  PHQ - 2 Score 0 0 0 0 0 0 0  PHQ- 9 Score 2          Fall Risk     02/22/2024    1:10 PM 02/21/2024    9:38 AM 07/02/2023   10:30 AM 02/26/2023   11:39 AM 02/17/2023    9:05 AM  Fall Risk   Falls in the past year? 0 0 0 0 0  Number falls in past yr: 0 0 0 0 0  Injury with Fall? 0 0 0 0 0  Risk for fall due to : No Fall Risks Medication side effect No Fall Risks Medication side effect No Fall Risks  Follow up Falls prevention discussed Falls prevention discussed;Falls evaluation completed Falls prevention discussed Falls prevention discussed;Education provided;Falls evaluation completed Falls prevention discussed    MEDICARE RISK AT HOME:  Medicare Risk at Home Any stairs in or around the home?: (Patient-Rptd) No Home free of loose throw rugs in walkways, pet beds, electrical cords, etc?: (Patient-Rptd) Yes Adequate lighting in your home to reduce risk of falls?: (Patient-Rptd) Yes Life alert?: (Patient-Rptd) No Use of a cane, walker or w/c?: (Patient-Rptd) No Grab bars in the bathroom?: (Patient-Rptd) Yes Shower chair or bench in shower?: (Patient-Rptd) Yes Elevated toilet seat or a handicapped toilet?: (Patient-Rptd) Yes  TIMED UP AND GO:  Was the test performed?  No  Cognitive Function: 6CIT completed        02/28/2024    3:44  PM  02/26/2023    3:33 PM  6CIT Screen  What Year? 0 points 0 points  What month? 0 points 0 points  What time? 0 points 0 points  Count back from 20 0 points 0 points  Months in reverse 0 points 0 points  Repeat phrase 0 points 0 points  Total Score 0 points 0 points    Immunizations Immunization History  Administered Date(s) Administered   Fluad Quad(high Dose 65+) 07/08/2021, 06/22/2022   Influenza-Unspecified 08/14/2020, 07/30/2023   Moderna Covid-19 Vaccine Bivalent Booster 26yrs & up 07/30/2023   Moderna Sars-Covid-2 Vaccination 10/24/2019, 11/22/2019, 08/25/2020   PNEUMOCOCCAL CONJUGATE-20 11/05/2023   RSV,unspecified 11/05/2023   Zoster Recombinant(Shingrix) 02/17/2021, 05/12/2021    Screening Tests Health Maintenance  Topic Date Due   DTaP/Tdap/Td (1 - Tdap) 08/24/2024 (Originally 03/13/1960)   INFLUENZA VACCINE  04/28/2024   Medicare Annual Wellness (AWV)  02/27/2025   Pneumonia Vaccine 27+ Years old  Completed   DEXA SCAN  Completed   Zoster Vaccines- Shingrix  Completed   HPV VACCINES  Aged Out   Meningococcal B Vaccine  Aged Out   COVID-19 Vaccine  Discontinued   Hepatitis C Screening  Discontinued    Health Maintenance  There are no preventive care reminders to display for this patient. Health Maintenance Items Addressed: Due for TDAP vaccine.  Additional Screening:  Vision Screening: Recommended annual ophthalmology exams for early detection of glaucoma and other disorders of the eye. Would you like a referral to an eye doctor? No    Dental Screening: Recommended annual dental exams for proper oral hygiene  Community Resource Referral / Chronic Care Management: CRR required this visit?  No   CCM required this visit?  No   Plan:    I have personally reviewed and noted the following in the patient's chart:   Medical and social history Use of alcohol, tobacco or illicit drugs  Current medications and supplements including opioid prescriptions.  Patient is not currently taking opioid prescriptions. Functional ability and status Nutritional status Physical activity Advanced directives List of other physicians Hospitalizations, surgeries, and ER visits in previous 12 months Vitals Screenings to include cognitive, depression, and falls Referrals and appointments  In addition, I have reviewed and discussed with patient certain preventive protocols, quality metrics, and best practice recommendations. A written personalized care plan for preventive services as well as general preventive health recommendations were provided to patient.   Areatha Beecham, LPN   10/03/1094   After Visit Summary: (MyChart) Due to this being a telephonic visit, the after visit summary with patients personalized plan was offered to patient via MyChart   Notes: Nothing significant to report at this time.

## 2024-02-28 NOTE — Patient Instructions (Signed)
 Nicole Guzman , Thank you for taking time out of your busy schedule to complete your Annual Wellness Visit with me. I enjoyed our conversation and look forward to speaking with you again next year. I, as well as your care team,  appreciate your ongoing commitment to your health goals. Please review the following plan we discussed and let me know if I can assist you in the future. Your Game plan/ To Do List    Referrals: If you haven't heard from the office you've been referred to, please reach out to them at the phone provided.  N/a Follow up Visits: Next Medicare AWV with our clinical staff: 03/02/2025 at 3:40   Have you seen your provider in the last 6 months (3 months if uncontrolled diabetes)? Yes Next Office Visit with your provider: 04/12/2024 at 10:00  Clinician Recommendations:  Aim for 30 minutes of exercise or brisk walking, 6-8 glasses of water, and 5 servings of fruits and vegetables each day.       This is a list of the screening recommended for you and due dates:  Health Maintenance  Topic Date Due   DTaP/Tdap/Td vaccine (1 - Tdap) 08/24/2024*   Flu Shot  04/28/2024   Medicare Annual Wellness Visit  02/27/2025   Pneumonia Vaccine  Completed   DEXA scan (bone density measurement)  Completed   Zoster (Shingles) Vaccine  Completed   HPV Vaccine  Aged Out   Meningitis B Vaccine  Aged Out   COVID-19 Vaccine  Discontinued   Hepatitis C Screening  Discontinued  *Topic was postponed. The date shown is not the original due date.    Advanced directives: (Copy Requested) Please bring a copy of your health care power of attorney and living will to the office to be added to your chart at your convenience. You can mail to [facility] [address] [place], [place] [address] or email to [email or web address] Advance Care Planning is important because it:  [x]  Makes sure you receive the medical care that is consistent with your values, goals, and preferences  [x]   It provides guidance to your family and loved ones and reduces their decisional burden about whether or not they are making the right decisions based on your wishes.  Follow the link provided in your after visit summary or read over the paperwork we have mailed to you to help you started getting your Advance Directives in place. If you need assistance in completing these, please reach out to us  so that we can help you!  See attachments for Preventive Care and Fall Prevention Tips.

## 2024-03-13 ENCOUNTER — Telehealth: Payer: Self-pay

## 2024-03-13 DIAGNOSIS — M818 Other osteoporosis without current pathological fracture: Secondary | ICD-10-CM

## 2024-03-13 NOTE — Telephone Encounter (Signed)
 Prolia  VOB initiated via [email or web address]  Next Prolia  inj DUE: 04/11/24

## 2024-03-14 ENCOUNTER — Other Ambulatory Visit (HOSPITAL_COMMUNITY): Payer: Self-pay

## 2024-03-14 NOTE — Telephone Encounter (Signed)
 Pt ready for scheduling for PROLIA  on or after : 04/11/24  Option# 1: Buy/Bill (Office supplied medication)  Out-of-pocket cost due at time of clinic visit: $357  Number of injection/visits approved: 2  Primary: [employer] Prolia  co-insurance: 20% Admin fee co-insurance: 20%  Secondary: --- Prolia  co-insurance:  Admin fee co-insurance:   Medical Benefit Details: Date Benefits were checked: 03/14/24 Deductible: NO/ Coinsurance: 20%/ Admin Fee: 20%  Prior Auth: APPROVED PA# [id number] Expiration Date: 09/29/23-09/27/24  # of doses approved: 2 ----------------------------------------------------------------------- Option# 2- Med Obtained from pharmacy:  Pharmacy benefit: Copay $0 (Paid to pharmacy) Admin Fee: 20% approximately $25 (Pay at clinic)  Prior Auth: N/A PA# Expiration Date:   # of doses approved:   If patient wants fill through the pharmacy benefit please send prescription to: [employer], and include estimated need by date in rx notes. Pharmacy will ship medication directly to the office.  Patient NOT eligible for Prolia  Copay Card. Copay Card can make patient's cost as little as $25. Link to apply: [email or web address]://[email or web address]  ** This summary of benefits is an estimation of the patient's out-of-pocket cost. Exact cost may very based on individual plan coverage.

## 2024-03-14 NOTE — Telephone Encounter (Signed)
 Nicole Guzman

## 2024-03-20 ENCOUNTER — Other Ambulatory Visit: Payer: Self-pay

## 2024-03-20 NOTE — Progress Notes (Signed)
 Per Benefits Investigation notes if patient will fill through pharmacy benefit Rx should go to [facility]. Disenrolling.

## 2024-03-29 ENCOUNTER — Inpatient Hospital Stay: Payer: Medicare PPO

## 2024-03-29 ENCOUNTER — Ambulatory Visit: Payer: Medicare PPO | Admitting: Family

## 2024-04-03 DIAGNOSIS — C50411 Malignant neoplasm of upper-outer quadrant of right female breast: Secondary | ICD-10-CM | POA: Diagnosis not present

## 2024-04-03 DIAGNOSIS — Z170 Estrogen receptor positive status [ER+]: Secondary | ICD-10-CM | POA: Diagnosis not present

## 2024-04-04 DIAGNOSIS — D2262 Melanocytic nevi of left upper limb, including shoulder: Secondary | ICD-10-CM | POA: Diagnosis not present

## 2024-04-04 DIAGNOSIS — L218 Other seborrheic dermatitis: Secondary | ICD-10-CM | POA: Diagnosis not present

## 2024-04-04 DIAGNOSIS — L820 Inflamed seborrheic keratosis: Secondary | ICD-10-CM | POA: Diagnosis not present

## 2024-04-04 DIAGNOSIS — D485 Neoplasm of uncertain behavior of skin: Secondary | ICD-10-CM | POA: Diagnosis not present

## 2024-04-04 DIAGNOSIS — L814 Other melanin hyperpigmentation: Secondary | ICD-10-CM | POA: Diagnosis not present

## 2024-04-04 DIAGNOSIS — D225 Melanocytic nevi of trunk: Secondary | ICD-10-CM | POA: Diagnosis not present

## 2024-04-04 DIAGNOSIS — L821 Other seborrheic keratosis: Secondary | ICD-10-CM | POA: Diagnosis not present

## 2024-04-04 DIAGNOSIS — D2261 Melanocytic nevi of right upper limb, including shoulder: Secondary | ICD-10-CM | POA: Diagnosis not present

## 2024-04-05 ENCOUNTER — Other Ambulatory Visit (HOSPITAL_COMMUNITY): Payer: Self-pay

## 2024-04-10 DIAGNOSIS — Z170 Estrogen receptor positive status [ER+]: Secondary | ICD-10-CM | POA: Diagnosis not present

## 2024-04-10 DIAGNOSIS — C50411 Malignant neoplasm of upper-outer quadrant of right female breast: Secondary | ICD-10-CM | POA: Diagnosis not present

## 2024-04-12 ENCOUNTER — Ambulatory Visit: Payer: Medicare PPO

## 2024-04-12 ENCOUNTER — Other Ambulatory Visit: Payer: Self-pay

## 2024-04-12 MED ORDER — DENOSUMAB 60 MG/ML ~~LOC~~ SOSY
60.0000 mg | PREFILLED_SYRINGE | Freq: Once | SUBCUTANEOUS | 0 refills | Status: DC
  Filled 2024-04-12: qty 1, 180d supply, fill #0

## 2024-04-12 NOTE — Addendum Note (Signed)
 Addended by: ALESSANDRA DEDRA PARAS on: 04/12/2024 09:19 AM   Modules accepted: Orders

## 2024-04-12 NOTE — Telephone Encounter (Signed)
 Pt was scheduled for a NV for Prolia  on 04/12/24. Medication not in clinic. Pt called by Temi O, PAA, to cancel NV. Pt aware. Prescription for Prolia  sent to [facility] for refill to be delivered to the office. Will contact pt once med received to schedule NV.

## 2024-04-12 NOTE — Progress Notes (Signed)
 Specialty Pharmacy Refill Coordination Note  Nicole Guzman is a 83 y.o. female assessed today regarding refills of clinic administered specialty medication(s) Denosumab  (PROLIA )   Clinic requested Courier to Provider Office   Delivery date: 04/13/24   Verified address: [address] [facility]   Medication will be filled on 07.16.25.

## 2024-04-19 ENCOUNTER — Ambulatory Visit (INDEPENDENT_AMBULATORY_CARE_PROVIDER_SITE_OTHER)

## 2024-04-19 ENCOUNTER — Ambulatory Visit

## 2024-04-19 DIAGNOSIS — M818 Other osteoporosis without current pathological fracture: Secondary | ICD-10-CM | POA: Diagnosis not present

## 2024-04-19 NOTE — Progress Notes (Signed)
 Patient presents for 6 month Prolia  injection. Injection placed in left arm subcutaneous region. Patient tolerated procedure well with no concerns. Nicole Guzman will return back in 6 months for next Prolia  injection.

## 2024-04-25 MED ORDER — DENOSUMAB 60 MG/ML ~~LOC~~ SOSY
60.0000 mg | PREFILLED_SYRINGE | Freq: Once | SUBCUTANEOUS | Status: AC
  Administered 2024-10-26: 60 mg via SUBCUTANEOUS

## 2024-04-25 NOTE — Addendum Note (Signed)
 Addended by: GLADIS CLAUDENE GRATE Y on: 04/25/2024 11:46 AM   Modules accepted: Orders

## 2024-04-26 ENCOUNTER — Inpatient Hospital Stay: Admitting: Family

## 2024-04-26 ENCOUNTER — Inpatient Hospital Stay

## 2024-04-28 ENCOUNTER — Inpatient Hospital Stay: Attending: Hematology & Oncology

## 2024-04-28 ENCOUNTER — Ambulatory Visit: Payer: Self-pay | Admitting: Hematology & Oncology

## 2024-04-28 ENCOUNTER — Ambulatory Visit: Admitting: Hematology & Oncology

## 2024-04-28 ENCOUNTER — Other Ambulatory Visit: Payer: Self-pay | Admitting: *Deleted

## 2024-04-28 ENCOUNTER — Encounter: Payer: Self-pay | Admitting: Hematology & Oncology

## 2024-04-28 VITALS — BP 144/82 | HR 74 | Temp 98.8°F | Resp 20 | Ht 65.0 in | Wt 169.4 lb

## 2024-04-28 DIAGNOSIS — Z1721 Progesterone receptor positive status: Secondary | ICD-10-CM | POA: Diagnosis not present

## 2024-04-28 DIAGNOSIS — C50912 Malignant neoplasm of unspecified site of left female breast: Secondary | ICD-10-CM | POA: Insufficient documentation

## 2024-04-28 DIAGNOSIS — Z79811 Long term (current) use of aromatase inhibitors: Secondary | ICD-10-CM | POA: Insufficient documentation

## 2024-04-28 DIAGNOSIS — Z1732 Human epidermal growth factor receptor 2 negative status: Secondary | ICD-10-CM | POA: Diagnosis not present

## 2024-04-28 DIAGNOSIS — Z170 Estrogen receptor positive status [ER+]: Secondary | ICD-10-CM | POA: Diagnosis not present

## 2024-04-28 DIAGNOSIS — I4891 Unspecified atrial fibrillation: Secondary | ICD-10-CM | POA: Diagnosis not present

## 2024-04-28 DIAGNOSIS — Z7901 Long term (current) use of anticoagulants: Secondary | ICD-10-CM | POA: Insufficient documentation

## 2024-04-28 DIAGNOSIS — C50911 Malignant neoplasm of unspecified site of right female breast: Secondary | ICD-10-CM | POA: Insufficient documentation

## 2024-04-28 DIAGNOSIS — Z923 Personal history of irradiation: Secondary | ICD-10-CM | POA: Diagnosis not present

## 2024-04-28 LAB — CBC WITH DIFFERENTIAL (CANCER CENTER ONLY)
Abs Immature Granulocytes: 0.02 K/uL (ref 0.00–0.07)
Basophils Absolute: 0 K/uL (ref 0.0–0.1)
Basophils Relative: 1 %
Eosinophils Absolute: 0.1 K/uL (ref 0.0–0.5)
Eosinophils Relative: 1 %
HCT: 42 % (ref 36.0–46.0)
Hemoglobin: 13.5 g/dL (ref 12.0–15.0)
Immature Granulocytes: 0 %
Lymphocytes Relative: 28 %
Lymphs Abs: 1.7 K/uL (ref 0.7–4.0)
MCH: 31.7 pg (ref 26.0–34.0)
MCHC: 32.1 g/dL (ref 30.0–36.0)
MCV: 98.6 fL (ref 80.0–100.0)
Monocytes Absolute: 0.6 K/uL (ref 0.1–1.0)
Monocytes Relative: 10 %
Neutro Abs: 3.7 K/uL (ref 1.7–7.7)
Neutrophils Relative %: 60 %
Platelet Count: 263 K/uL (ref 150–400)
RBC: 4.26 MIL/uL (ref 3.87–5.11)
RDW: 14.4 % (ref 11.5–15.5)
WBC Count: 6.1 K/uL (ref 4.0–10.5)
nRBC: 0 % (ref 0.0–0.2)

## 2024-04-28 LAB — CMP (CANCER CENTER ONLY)
ALT: 13 U/L (ref 0–44)
AST: 19 U/L (ref 15–41)
Albumin: 4 g/dL (ref 3.5–5.0)
Alkaline Phosphatase: 63 U/L (ref 38–126)
Anion gap: 8 (ref 5–15)
BUN: 13 mg/dL (ref 8–23)
CO2: 29 mmol/L (ref 22–32)
Calcium: 9.7 mg/dL (ref 8.9–10.3)
Chloride: 105 mmol/L (ref 98–111)
Creatinine: 0.86 mg/dL (ref 0.44–1.00)
GFR, Estimated: >60 mL/min (ref >60)
Glucose, Bld: 81 mg/dL (ref 70–99)
Potassium: 4.4 mmol/L (ref 3.5–5.1)
Sodium: 141 mmol/L (ref 135–145)
Total Bilirubin: 0.6 mg/dL (ref 0.0–1.2)
Total Protein: 6.7 g/dL (ref 6.5–8.1)

## 2024-04-28 LAB — LACTATE DEHYDROGENASE: LDH: 202 U/L — ABNORMAL HIGH (ref 98–192)

## 2024-04-28 NOTE — Progress Notes (Signed)
 Hematology and Oncology Follow Up Visit  Nicole Guzman [id number] 1941-05-10 83 y.o. 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 daily     Interim History:  Nicole Guzman is back for follow-up.  So far, everything is going pretty well with Nicole Guzman.  She has had a very quiet summer.  She has really had no vacation.  She does spend time outside.  She is on the Aromasin .  She is doing well on the Aromasin .  She does not have any problems with arthralgias or myalgias.  She has had no change in bowel or bladder habits.  There is been no nausea or vomiting.  She has had no cough or shortness of breath..  She does have atrial fibrillation.  She is on Xarelto .  Cardiology is managing this.  She has had no leg swelling.  There is been no rashes.  Overall, I would say that Nicole Guzman performance status is probably ECOG 1.    Medications:  Current Outpatient Medications:    Calcium Carb-Cholecalciferol (CALCIUM 500/D) 500-400 MG-UNIT CHEW, Chew by mouth., Disp: , Rfl:    COMBIGAN 0.2-0.5 % ophthalmic solution, Apply 1 drop to eye 2 (two) times daily., Disp: , Rfl:    denosumab  (PROLIA ) 60 MG/ML SOSY injection, Inject 60 mg into the skin every 6 (six) months., Disp: 1 mL, Rfl: 1   dorzolamide (TRUSOPT) 2 % ophthalmic solution, Place 1 drop into both eyes 2 (two) times daily., Disp: , Rfl:    exemestane  (AROMASIN ) 25 MG tablet, Take 1 tablet (25 mg total) by mouth daily after breakfast., Disp: 30 tablet, Rfl: 12   metoprolol  tartrate (LOPRESSOR ) 25 MG  tablet, Take 1 tablet by mouth twice daily, Disp: 180 tablet, Rfl: 3   Multiple Vitamins-Minerals (MULTIVITAMIN WITH MINERALS) tablet, Take 1 tablet by mouth daily., Disp: , Rfl:    Travoprost, BAK Free, (TRAVATAN) 0.004 % SOLN ophthalmic solution, INSTILL 1 DROP INTO EACH EYE EVERY DAY AT BEDTIME, Disp: , Rfl:    Vitamin D , Cholecalciferol, 25 MCG (1000 UT) TABS, Take by mouth., Disp: , Rfl:    XARELTO  20 MG TABS tablet, Take 1 tablet by mouth once daily, Disp: 30 tablet, Rfl: 11  Current Facility-Administered Medications:    [START ON 10/26/2024] denosumab  (PROLIA ) injection 60 mg, 60 mg, Subcutaneous, Once, Rudd, Garnette HERO, MD  Allergies: No Known Allergies  Past Medical History, Surgical history, Social history, and Family History were reviewed and updated.  Review of Systems: Review of Systems  Constitutional: Negative.   HENT:  Negative.    Eyes: Negative.   Respiratory: Negative.    Cardiovascular: Negative.   Gastrointestinal: Negative.   Endocrine: Negative.   Genitourinary: Negative.    Musculoskeletal: Negative.   Skin: Negative.   Neurological: Negative.   Hematological: Negative.   Psychiatric/Behavioral: Negative.      Physical Exam:  height is 5' 5 (1.651 m) and weight is 169 lb 6.4 oz (76.8 kg). Nicole Guzman oral temperature is 98.8 F (37.1 C). Nicole Guzman blood pressure is 144/82 (abnormal) and Nicole Guzman pulse is 74. Nicole Guzman respiration is  20 and oxygen saturation is 96%.   Wt Readings from Last 3 Encounters:  04/28/24 169 lb 6.4 oz (76.8 kg)  02/22/24 168 lb 12.8 oz (76.6 kg)  01/26/24 167 lb 8 oz (76 kg)    Physical Exam Vitals reviewed.  Constitutional:      Comments: Right breast shows the healing lumpectomy scar at about the 10 o'clock position.  She has no erythema.  There is no swelling.  There is no nipple discharge.  There is no right axillary adenopathy. .   Left breast shows changes from lumpectomy and radiation.  She has the lumpectomy scar at about the 8 o'clock  position.  This is well-healed.  There is no left axillary adenopathy.  HENT:     Head: Normocephalic and atraumatic.  Eyes:     Pupils: Pupils are equal, round, and reactive to light.  Cardiovascular:     Rate and Rhythm: Normal rate and regular rhythm.     Heart sounds: Normal heart sounds.  Pulmonary:     Effort: Pulmonary effort is normal.     Breath sounds: Normal breath sounds.  Abdominal:     General: Bowel sounds are normal.     Palpations: Abdomen is soft.  Musculoskeletal:        General: No tenderness or deformity. Normal range of motion.     Cervical back: Normal range of motion.  Lymphadenopathy:     Cervical: No cervical adenopathy.  Skin:    General: Skin is warm and dry.     Findings: No erythema or rash.  Neurological:     Mental Status: She is alert and oriented to person, place, and time.  Psychiatric:        Behavior: Behavior normal.        Thought Content: Thought content normal.        Judgment: Judgment normal.      Lab Results  Component Value Date   WBC 6.1 04/28/2024   HGB 13.5 04/28/2024   HCT 42.0 04/28/2024   MCV 98.6 04/28/2024   PLT 263 04/28/2024     Chemistry      Component Value Date/Time   NA 141 01/26/2024 1008   K 4.4 01/26/2024 1008   CL 103 01/26/2024 1008   CO2 33 (H) 01/26/2024 1008   BUN 12 01/26/2024 1008   CREATININE 0.76 01/26/2024 1008      Component Value Date/Time   CALCIUM 9.8 01/26/2024 1008   ALKPHOS 48 01/26/2024 1008   AST 16 01/26/2024 1008   ALT 10 01/26/2024 1008   BILITOT 0.7 01/26/2024 1008      Impression and Plan: Nicole Guzman is a very nice 83 year old postmenopausal white female.  She has had history of stage IIa infiltrating lobular carcinoma of the left breast.  She had treatment for this.  This was about 11 years ago with radiation and surgery.  She was on Arimidex .  We stopped the Arimidex  in October.  She now has a new breast cancer.  This is a stage I ductal carcinoma the right  breast.  She underwent radiotherapy for this.  She is now on Aromasin .    At this point, we can probably move Nicole Guzman appointments out little bit longer.  At this point, I think we can probably get Nicole Guzman back after the Holiday season.  I think that she really should be doing well at the end of the year.       Maude JONELLE Crease, MD 8/1/202512:52 PM

## 2024-04-28 NOTE — Progress Notes (Signed)
 BP remains elevated, 144 82, instructed to monitor at home and notify PCP if it remains over 140/90. Verbalized understanding.

## 2024-05-03 DIAGNOSIS — Z923 Personal history of irradiation: Secondary | ICD-10-CM | POA: Diagnosis not present

## 2024-05-03 DIAGNOSIS — Z853 Personal history of malignant neoplasm of breast: Secondary | ICD-10-CM | POA: Diagnosis not present

## 2024-05-03 DIAGNOSIS — Z170 Estrogen receptor positive status [ER+]: Secondary | ICD-10-CM | POA: Diagnosis not present

## 2024-05-03 DIAGNOSIS — C50411 Malignant neoplasm of upper-outer quadrant of right female breast: Secondary | ICD-10-CM | POA: Diagnosis not present

## 2024-05-22 ENCOUNTER — Other Ambulatory Visit: Payer: Self-pay

## 2024-06-30 DIAGNOSIS — E785 Hyperlipidemia, unspecified: Secondary | ICD-10-CM | POA: Diagnosis not present

## 2024-06-30 DIAGNOSIS — I4891 Unspecified atrial fibrillation: Secondary | ICD-10-CM | POA: Diagnosis not present

## 2024-06-30 DIAGNOSIS — I10 Essential (primary) hypertension: Secondary | ICD-10-CM | POA: Diagnosis not present

## 2024-06-30 DIAGNOSIS — D6869 Other thrombophilia: Secondary | ICD-10-CM | POA: Diagnosis not present

## 2024-06-30 DIAGNOSIS — M810 Age-related osteoporosis without current pathological fracture: Secondary | ICD-10-CM | POA: Diagnosis not present

## 2024-06-30 DIAGNOSIS — C50919 Malignant neoplasm of unspecified site of unspecified female breast: Secondary | ICD-10-CM | POA: Diagnosis not present

## 2024-06-30 DIAGNOSIS — D84821 Immunodeficiency due to drugs: Secondary | ICD-10-CM | POA: Diagnosis not present

## 2024-06-30 DIAGNOSIS — H409 Unspecified glaucoma: Secondary | ICD-10-CM | POA: Diagnosis not present

## 2024-06-30 DIAGNOSIS — Z7901 Long term (current) use of anticoagulants: Secondary | ICD-10-CM | POA: Diagnosis not present

## 2024-07-11 ENCOUNTER — Ambulatory Visit: Payer: Self-pay

## 2024-07-11 NOTE — Telephone Encounter (Signed)
 FYI Only or Action Required?: FYI only for provider.  Patient was last seen in primary care on 02/22/2024 by Billy Knee, FNP.  Called Nurse Triage reporting Hypertension.  Symptoms began today.  Interventions attempted: Other: monitoring blood pressure.  Symptoms are: rapidly improving.  Triage Disposition: See PCP Within 2 Weeks  Patient/caregiver understands and will follow disposition?: Yes        Reason for Disposition  [1] Systolic BP >= 130 OR Diastolic >= 80 AND [2] taking BP medications  Answer Assessment - Initial Assessment Questions Additional info: 1) Self scheduled 07/12/24. Received call from [facility] to triage patient. This writer triaged and appointment is appropriate.  2) patient reports her home care nurse reported to her that her blood pressure was high and to schedule an appointment with pcp. Patient does not recall the blood pressure measured by home health nurse but reports her own readings today, see below.   1. BLOOD PRESSURE: What is your blood pressure? Did you take at least two measurements 5 minutes apart?     117/81 am, 127/80 this afternoon  2. ONSET: When did you take your blood pressure?     today 3. HOW: How did you take your blood pressure? (e.g., automatic home BP monitor, visiting nurse)     Home cuff 4. HISTORY: Do you have a history of high blood pressure?     yes 5. MEDICINES: Are you taking any medicines for blood pressure? Have you missed any doses recently?     Taking as prescribed.  6. OTHER SYMPTOMS: Do you have any symptoms? (e.g., blurred vision, chest pain, difficulty breathing, headache, weakness)     Denies all symptoms  Protocols used: Blood Pressure - High-A-AH

## 2024-07-12 ENCOUNTER — Encounter: Payer: Self-pay | Admitting: Family Medicine

## 2024-07-12 ENCOUNTER — Ambulatory Visit: Admitting: Family Medicine

## 2024-07-12 VITALS — BP 128/84 | HR 88 | Temp 97.0°F | Ht 65.0 in | Wt 171.4 lb

## 2024-07-12 DIAGNOSIS — Z23 Encounter for immunization: Secondary | ICD-10-CM

## 2024-07-12 DIAGNOSIS — Z170 Estrogen receptor positive status [ER+]: Secondary | ICD-10-CM | POA: Diagnosis not present

## 2024-07-12 DIAGNOSIS — H6122 Impacted cerumen, left ear: Secondary | ICD-10-CM | POA: Diagnosis not present

## 2024-07-12 DIAGNOSIS — I4811 Longstanding persistent atrial fibrillation: Secondary | ICD-10-CM

## 2024-07-12 DIAGNOSIS — I10 Essential (primary) hypertension: Secondary | ICD-10-CM | POA: Diagnosis not present

## 2024-07-12 DIAGNOSIS — C50411 Malignant neoplasm of upper-outer quadrant of right female breast: Secondary | ICD-10-CM

## 2024-07-12 NOTE — Progress Notes (Signed)
 [facility] [address] [place] [address] Dept: [phone] Dept Fax: [phone]  Chronic Care Office Visit  Subjective:    Patient ID: Nicole Guzman, female    DOB: 08/29/1941, 83 y.o..   MRN: [id number]  Chief Complaint  Patient presents with   Hypertension    High blood pressure. Home health nurse checked at the beginning of this month and it was 160/98. Has BP log. Trouble hearing   History of Present Illness:  Patient is in today for reassessment of chronic medical issues.  [name] has a history of prior left breast cancer. At her annual mammogram on 05/18/2023 , she was noted to have a new right breast mass. 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 She underwent lumpectomy on 07/20/2023 and a short course of radiation. She is currently managed on exemestane  (Aromasin ) 25 mg daily for 5 years. Her recent mammogram was normal.   [name] has a history of atrial fibrillation. She is managed on metoprolol  tartrate 25 mg bid. She has not had any tachycardic episodes. She is also on rivaroxaban  (Xarelto ) 20 mg daily for stroke prevention.  [name] had a visit by a nurse with her insurance company recently. At the visit, her BP was 160/98. She has been monitoring her BP at home. Her last 7 readings average 123/82.  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   Past Medical History: Patient Active Problem List   Diagnosis Date Noted   Status post right breast lumpectomy 08/23/2023   Age-related osteoporosis without fracture 08/23/2023   Chronic anticoagulation 06/28/2023   Malignant neoplasm of upper-outer quadrant of right breast in female, estrogen receptor positive (HCC) 06/28/2023   Combined forms of age-related cataract of left eye 06/21/2023   Combined forms of age-related cataract of right eye 06/21/2023   Primary open angle glaucoma (POAG) of right  eye, severe stage 04/26/2023   Other chest pain 11/25/2022   Stage II breast cancer, left (HCC) 01/16/2021   Borderline hyperlipidemia 12/26/2020   Vitamin D  deficiency 12/26/2020   Allergic rhinitis 12/26/2020   Diverticulosis 12/26/2020   History of breast cancer 12/11/2020   Glaucoma 12/11/2020   Atrial fibrillation (HCC) 12/11/2020   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     COMBIGAN 0.2-0.5 % ophthalmic solution Apply 1 drop to eye 2 (two) times daily.     denosumab  (PROLIA ) 60 MG/ML SOSY injection Inject 60 mg into the skin every 6 (six) months. 1 mL 1   dorzolamide (TRUSOPT) 2 % ophthalmic solution Place 1 drop into both eyes 2 (two) times daily.     exemestane  (AROMASIN ) 25 MG tablet Take 1 tablet (25 mg total) by mouth daily after breakfast. 30 tablet 12   metoprolol  tartrate (LOPRESSOR ) 25  MG tablet Take 1 tablet by mouth twice daily 180 tablet 3   Multiple Vitamins-Minerals (MULTIVITAMIN WITH MINERALS) tablet Take 1 tablet by mouth daily.     Travoprost, BAK Free, (TRAVATAN) 0.004 % SOLN ophthalmic solution INSTILL 1 DROP INTO EACH EYE EVERY DAY AT BEDTIME     Vitamin D , Cholecalciferol, 25 MCG (1000 UT) TABS Take by mouth.     XARELTO  20 MG TABS tablet Take 1 tablet by mouth once daily 30 tablet 11   Facility-Administered Medications Prior to Visit  Medication Dose Route Frequency Provider Last  Rate Last Admin   [START ON 10/26/2024] denosumab  (PROLIA ) injection 60 mg  60 mg Subcutaneous Once Thedora Garnette HERO, MD       No Known Allergies Objective:   Today's Vitals   07/12/24 1404  BP: 128/84  Pulse: 88  Temp: (!) 97 F (36.1 C)  TempSrc: Temporal  SpO2: 94%  Weight: 171 lb 6.4 oz (77.7 kg)  Height: 5' 5 (1.651 m)   Body mass index is 28.52 kg/m.   General: Well developed, well nourished. No acute distress. HEENT: Normocephalic, non-traumatic. External ears normal. Right EAC clear. The left EAC has impacted wax.  Psych: Alert and oriented. Normal mood and affect.  Health Maintenance Due  Topic Date Due   Influenza Vaccine  04/28/2024   Mammogram  05/17/2024     PROCEDURE- Ear Wax Removal Indication: Impacted ear wax  PARQ reviewed with patient. Verbal consent obtained. Flushed left ear with mixture of warm water and hydrogen peroxide. Lighted curette used to remove wax. Patient tolerated procedure well. Tehre was some irritaiton of the ear canal afterwards, but no visible bleeding.  Assessment & Plan:   Problem List Items Addressed This Visit       Cardiovascular and Mediastinum   Atrial fibrillation (HCC)   Stable. Rate well controlled. Continue metoprolol  tartrate 25 mg twice a day and Xarelto  20 mg daily.        Other   Malignant neoplasm of upper-outer quadrant of right breast in female, estrogen receptor positive (HCC)   Stable. Continue exemestane  (Aromasin ) 25 mg daily for 5 years.      Other Visit Diagnoses       Elevated blood pressure reading with diagnosis of hypertension    -  Primary   Appears to have been a single occurence. Average BP looks good. Recommend we monitor this for now.     Impacted cerumen of left ear       Removed as noted above.     Flu vaccine need       Relevant Orders   Flu vaccine HIGH DOSE PF(Fluzone Trivalent) (Completed)       Return in about 6 months (around 01/10/2025) for Reassessment.   Garnette HERO Thedora,  MD

## 2024-07-12 NOTE — Assessment & Plan Note (Signed)
Stable. Rate well controlled. Continue metoprolol tartrate 25 mg twice a day and Xarelto 20 mg daily.

## 2024-07-12 NOTE — Assessment & Plan Note (Signed)
 Stable. Continue exemestane  (Aromasin ) 25 mg daily for 5 years.

## 2024-07-12 NOTE — Telephone Encounter (Signed)
 Noted appointment today. Dm/cma

## 2024-07-31 ENCOUNTER — Encounter: Payer: Self-pay | Admitting: Radiology

## 2024-08-07 DIAGNOSIS — H401122 Primary open-angle glaucoma, left eye, moderate stage: Secondary | ICD-10-CM | POA: Diagnosis not present

## 2024-08-07 DIAGNOSIS — H401113 Primary open-angle glaucoma, right eye, severe stage: Secondary | ICD-10-CM | POA: Diagnosis not present

## 2024-08-07 DIAGNOSIS — H25813 Combined forms of age-related cataract, bilateral: Secondary | ICD-10-CM | POA: Diagnosis not present

## 2024-09-07 DIAGNOSIS — H401122 Primary open-angle glaucoma, left eye, moderate stage: Secondary | ICD-10-CM | POA: Diagnosis not present

## 2024-09-07 DIAGNOSIS — H401113 Primary open-angle glaucoma, right eye, severe stage: Secondary | ICD-10-CM | POA: Diagnosis not present

## 2024-09-25 ENCOUNTER — Other Ambulatory Visit: Payer: Self-pay | Admitting: Family Medicine

## 2024-09-25 DIAGNOSIS — I4811 Longstanding persistent atrial fibrillation: Secondary | ICD-10-CM

## 2024-09-29 ENCOUNTER — Inpatient Hospital Stay: Attending: Hematology & Oncology

## 2024-09-29 ENCOUNTER — Inpatient Hospital Stay (HOSPITAL_BASED_OUTPATIENT_CLINIC_OR_DEPARTMENT_OTHER): Admitting: Family

## 2024-09-29 VITALS — BP 154/79 | HR 72 | Temp 97.8°F | Wt 167.8 lb

## 2024-09-29 DIAGNOSIS — Z170 Estrogen receptor positive status [ER+]: Secondary | ICD-10-CM

## 2024-09-29 DIAGNOSIS — Z1732 Human epidermal growth factor receptor 2 negative status: Secondary | ICD-10-CM | POA: Diagnosis not present

## 2024-09-29 DIAGNOSIS — Z79811 Long term (current) use of aromatase inhibitors: Secondary | ICD-10-CM | POA: Insufficient documentation

## 2024-09-29 DIAGNOSIS — I4891 Unspecified atrial fibrillation: Secondary | ICD-10-CM | POA: Diagnosis not present

## 2024-09-29 DIAGNOSIS — C50911 Malignant neoplasm of unspecified site of right female breast: Secondary | ICD-10-CM | POA: Diagnosis present

## 2024-09-29 DIAGNOSIS — C50411 Malignant neoplasm of upper-outer quadrant of right female breast: Secondary | ICD-10-CM

## 2024-09-29 DIAGNOSIS — C50912 Malignant neoplasm of unspecified site of left female breast: Secondary | ICD-10-CM

## 2024-09-29 DIAGNOSIS — Z7901 Long term (current) use of anticoagulants: Secondary | ICD-10-CM | POA: Diagnosis not present

## 2024-09-29 DIAGNOSIS — Z923 Personal history of irradiation: Secondary | ICD-10-CM | POA: Insufficient documentation

## 2024-09-29 DIAGNOSIS — Z1721 Progesterone receptor positive status: Secondary | ICD-10-CM | POA: Insufficient documentation

## 2024-09-29 LAB — CMP (CANCER CENTER ONLY)
ALT: 19 U/L (ref 0–44)
AST: 23 U/L (ref 15–41)
Albumin: 4.2 g/dL (ref 3.5–5.0)
Alkaline Phosphatase: 58 U/L (ref 38–126)
Anion gap: 9 (ref 5–15)
BUN: 11 mg/dL (ref 8–23)
CO2: 29 mmol/L (ref 22–32)
Calcium: 10.2 mg/dL (ref 8.9–10.3)
Chloride: 103 mmol/L (ref 98–111)
Creatinine: 0.87 mg/dL (ref 0.44–1.00)
GFR, Estimated: >60 mL/min
Glucose, Bld: 83 mg/dL (ref 70–99)
Potassium: 5 mmol/L (ref 3.5–5.1)
Sodium: 142 mmol/L (ref 135–145)
Total Bilirubin: 0.6 mg/dL (ref 0.0–1.2)
Total Protein: 7 g/dL (ref 6.5–8.1)

## 2024-09-29 LAB — CBC WITH DIFFERENTIAL (CANCER CENTER ONLY)
Abs Immature Granulocytes: 0.02 K/uL (ref 0.00–0.07)
Basophils Absolute: 0 K/uL (ref 0.0–0.1)
Basophils Relative: 0 %
Eosinophils Absolute: 0.1 K/uL (ref 0.0–0.5)
Eosinophils Relative: 1 %
HCT: 44.7 % (ref 36.0–46.0)
Hemoglobin: 14.4 g/dL (ref 12.0–15.0)
Immature Granulocytes: 0 %
Lymphocytes Relative: 33 %
Lymphs Abs: 2.2 K/uL (ref 0.7–4.0)
MCH: 31.6 pg (ref 26.0–34.0)
MCHC: 32.2 g/dL (ref 30.0–36.0)
MCV: 98 fL (ref 80.0–100.0)
Monocytes Absolute: 0.6 K/uL (ref 0.1–1.0)
Monocytes Relative: 9 %
Neutro Abs: 3.9 K/uL (ref 1.7–7.7)
Neutrophils Relative %: 57 %
Platelet Count: 281 K/uL (ref 150–400)
RBC: 4.56 MIL/uL (ref 3.87–5.11)
RDW: 13.8 % (ref 11.5–15.5)
WBC Count: 6.8 K/uL (ref 4.0–10.5)
nRBC: 0 % (ref 0.0–0.2)

## 2024-09-29 NOTE — Progress Notes (Signed)
 " Hematology and Oncology Follow Up Visit  Nicole Guzman [id number] 09-04-1941 84 y.o. 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 She is doing quite well and has no complaints at this time.  She is tolerating Aromasin  nicely.  No hot flashes or night sweats.  She states that she had an eye exam earlier in the year which showed some changes in her vision but at follow-up this had improved.  Mammogram in July of 2025 was negative.  Bilateral breast exam today was negative. No mass, lesion or rash noted. No discomfort.  No adenopathy or lymphedema.  No fever, chills, n/v, cough, rash, dizziness, SOB, chest pain, palpitations, abdominal pain or changes in bowel or bladder habits.  No swelling, numbness or tingling in her extremities.  No falls or syncope reported.  No blood loss, abnormal bruising or petechiae.  Appetite and hydration are good. Weight is stable at 167 lbs.   ECOG Performance Status: 1 - Symptomatic but completely ambulatory  Medications:  Allergies as of 09/29/2024   No Known Allergies      Medication List        Accurate as of September 29, 2024 10:18 AM. If you have any questions, ask your nurse or doctor.          Calcium 500/D 500-400 MG-UNIT tablet Generic drug: Calcium Carb-Cholecalciferol Chew by mouth.   Combigan 0.2-0.5 % ophthalmic solution Generic drug: brimonidine-timolol Apply 1 drop to eye 2 (two)  times daily.   dorzolamide 2 % ophthalmic solution Commonly known as: TRUSOPT Place 1 drop into both eyes 2 (two) times daily.   exemestane  25 MG tablet Commonly known as: AROMASIN  Take 1 tablet (25 mg total) by mouth daily after breakfast.   metoprolol  tartrate 25 MG tablet Commonly known as: LOPRESSOR  Take 1 tablet by mouth twice daily   multivitamin with minerals tablet Take 1 tablet by mouth daily.   Prolia  60 MG/ML Sosy injection Generic drug: denosumab  Inject 60 mg into the skin every 6 (six) months.   Travoprost (BAK Free) 0.004 % Soln ophthalmic solution Commonly known as: TRAVATAN INSTILL 1 DROP INTO EACH EYE EVERY DAY AT BEDTIME   Vitamin D  (Cholecalciferol) 25 MCG (1000 UT) Tabs Take by mouth.   Xarelto  20 MG Tabs tablet Generic drug: rivaroxaban  Take 1 tablet by mouth once daily        Allergies: Allergies[1]  Past Medical History, Surgical history, Social history, and Family History were reviewed and updated.  Review of Systems: All other 10 point review of systems is negative.   Physical Exam:  vitals were not taken for this visit.   Wt Readings from Last 3 Encounters:  07/12/24 171 lb 6.4 oz (77.7 kg)  04/28/24 169 lb 6.4 oz (76.8 kg)  02/22/24 168 lb 12.8 oz (76.6 kg)    Ocular: Sclerae unicteric, pupils equal, round and reactive to light Ear-nose-throat: Oropharynx clear, dentition fair Lymphatic: No cervical,  supraclavicular or axillary adenopathy Lungs no rales or rhonchi, good excursion bilaterally Heart regular rate and rhythm, no murmur appreciated Abd soft, nontender, positive bowel sounds MSK no focal spinal tenderness, no joint edema Neuro: non-focal, well-oriented, appropriate affect Breasts: Same as above.  Lab Results  Component Value Date   WBC 6.1 04/28/2024   HGB 13.5 04/28/2024   HCT 42.0 04/28/2024   MCV 98.6 04/28/2024   PLT 263 04/28/2024   No results found for: FERRITIN, IRON, TIBC, UIBC,  IRONPCTSAT Lab Results  Component Value Date   RBC 4.26 04/28/2024   No results found for: KPAFRELGTCHN, LAMBDASER, KAPLAMBRATIO No results found for: IGGSERUM, IGA, IGMSERUM No results found for: STEPHANY CARLOTA BENSON MARKEL EARLA JOANNIE DOC VICK, SPEI   Chemistry      Component Value Date/Time   NA 141 04/28/2024 1215   K 4.4 04/28/2024 1215   CL 105 04/28/2024 1215   CO2 29 04/28/2024 1215   BUN 13 04/28/2024 1215   CREATININE 0.86 04/28/2024 1215      Component Value Date/Time   CALCIUM 9.7 04/28/2024 1215   ALKPHOS 63 04/28/2024 1215   AST 19 04/28/2024 1215   ALT 13 04/28/2024 1215   BILITOT 0.6 04/28/2024 1215       Impression and Plan: Nicole Guzman is a very pleasant 84 yo postmenopausal caucasian female with history of stage IIa infiltrating lobular carcinoma of the left breast treated over 11 years ago with radiation and surgery.  She was on Arimidex  which we stopped in October 2022. She now has stage I ductal carcinoma the right breast diagnosed 05/2023. She was treated with lumpectomy followed by radiation.  She started Aromasin  09/2023 and continues to tolerate nicely.   No changes indicated at this time.  Follow-up in 4 months.   Lauraine Pepper, NP 1/2/202610:18 AM     [1] No Known Allergies  "

## 2024-10-03 ENCOUNTER — Other Ambulatory Visit (HOSPITAL_COMMUNITY): Payer: Self-pay

## 2024-10-03 ENCOUNTER — Telehealth: Payer: Self-pay

## 2024-10-03 NOTE — Telephone Encounter (Signed)
 Prolia  VOB initiated via [email or web address]  Next Prolia  inj DUE: 10/20/24

## 2024-10-10 ENCOUNTER — Other Ambulatory Visit (HOSPITAL_COMMUNITY): Payer: Self-pay

## 2024-10-10 NOTE — Telephone Encounter (Signed)
 SABRA

## 2024-10-10 NOTE — Telephone Encounter (Signed)
 Pt ready for scheduling for PROLIA  on or after : 10/20/24  Option# 1: Buy/Bill (Office supplied medication)  Out-of-pocket cost due at time of clinic visit: $502  Number of injection/visits approved: 2  Primary: [employer] Prolia  co-insurance: 20% Admin fee co-insurance: 20%  Secondary: --- Prolia  co-insurance:  Admin fee co-insurance:   Medical Benefit Details: Date Benefits were checked: 10/09/24 Deductible: $0 MET OF $125 REQUIRED/ Coinsurance: 20%/ Admin Fee: 20%  Prior Auth: APPROVED PA# [id number] Expiration Date: 09/28/24-09/27/25   # of doses approved: 2 ----------------------------------------------------------------------- Option# 2- Med Obtained from pharmacy:  Pharmacy benefit: Copay $55 (Paid to pharmacy) Admin Fee: 20% (Pay at clinic)  Prior Auth: N/A PA# Expiration Date:   # of doses approved:   If patient wants fill through the pharmacy benefit please send prescription to: [facility], and include estimated need by date in rx notes. Pharmacy will ship medication directly to the office.  Patient NOT eligible for Prolia  Copay Card. Copay Card can make patient's cost as little as $25. Link to apply: [email or web address]://[email or web address]  ** This summary of benefits is an estimation of the patient's out-of-pocket cost. Exact cost may very based on individual plan coverage.

## 2024-10-12 ENCOUNTER — Other Ambulatory Visit: Payer: Self-pay

## 2024-10-12 ENCOUNTER — Other Ambulatory Visit: Payer: Self-pay | Admitting: Family Medicine

## 2024-10-12 ENCOUNTER — Other Ambulatory Visit (HOSPITAL_COMMUNITY): Payer: Self-pay

## 2024-10-12 DIAGNOSIS — M818 Other osteoporosis without current pathological fracture: Secondary | ICD-10-CM

## 2024-10-12 MED ORDER — PROLIA 60 MG/ML ~~LOC~~ SOSY
60.0000 mg | PREFILLED_SYRINGE | Freq: Once | SUBCUTANEOUS | 0 refills | Status: AC
  Filled 2024-10-12: qty 1, 1d supply, fill #0
  Filled 2024-10-13: qty 1, 180d supply, fill #0

## 2024-10-13 ENCOUNTER — Telehealth: Payer: Self-pay

## 2024-10-13 ENCOUNTER — Other Ambulatory Visit: Payer: Self-pay

## 2024-10-13 NOTE — Telephone Encounter (Signed)
 Will call here once it has been received. Dm/cma

## 2024-10-17 ENCOUNTER — Other Ambulatory Visit (HOSPITAL_COMMUNITY): Payer: Self-pay

## 2024-10-19 ENCOUNTER — Other Ambulatory Visit: Payer: Self-pay

## 2024-10-19 NOTE — Progress Notes (Signed)
 Specialty Pharmacy Refill Coordination Note  Nicole Guzman is a 84 y.o. female contacted today regarding refills of specialty medication(s) Denosumab  (PROLIA )   Patient requested Courier to Provider Office   Delivery date: 10/25/24   Verified address: [address] [facility]   Medication will be filled on: 10/24/24  Patient notified of $55 copay and provided updated payment information.

## 2024-10-24 ENCOUNTER — Other Ambulatory Visit: Payer: Self-pay | Admitting: Hematology & Oncology

## 2024-10-24 ENCOUNTER — Other Ambulatory Visit: Payer: Self-pay

## 2024-10-24 NOTE — Telephone Encounter (Signed)
 Estimated delivery date for Prolia  is 10/25/24. $0 copay due to check in. Clinical: drop Admin fee in Wrap Up tab during NV and mark Patient-Supplied.

## 2024-10-25 ENCOUNTER — Other Ambulatory Visit: Payer: Self-pay

## 2024-10-25 NOTE — Telephone Encounter (Signed)
 Prolia  arrived to the office for the pt. Patient-supplied. $0 copay due at time of check-in.

## 2024-10-26 ENCOUNTER — Ambulatory Visit (INDEPENDENT_AMBULATORY_CARE_PROVIDER_SITE_OTHER)

## 2024-10-26 DIAGNOSIS — M818 Other osteoporosis without current pathological fracture: Secondary | ICD-10-CM | POA: Diagnosis not present

## 2024-10-26 MED ORDER — DENOSUMAB 60 MG/ML ~~LOC~~ SOSY: 60.0000 mg | PREFILLED_SYRINGE | Freq: Once | SUBCUTANEOUS | Status: AC

## 2024-10-26 NOTE — Progress Notes (Signed)
 Per orders of Dr Berneta, injection of Prolia  given in the RT arm sq by [facility], cma.  Patient tolerated injection well.  She will return in 6 months for her next one.  Dm/cma

## 2025-01-10 ENCOUNTER — Ambulatory Visit: Admitting: Family Medicine

## 2025-01-26 ENCOUNTER — Inpatient Hospital Stay: Admitting: Family

## 2025-01-26 ENCOUNTER — Inpatient Hospital Stay

## 2025-03-02 ENCOUNTER — Ambulatory Visit
